# Patient Record
Sex: Female | Born: 1990 | Race: Black or African American | Hispanic: No | Marital: Single | Smoking: Never smoker
Health system: Southern US, Community
[De-identification: ages and names within clinical notes are randomized; demographics above are authoritative.]

## PROBLEM LIST (undated history)

## (undated) ENCOUNTER — Inpatient Hospital Stay (HOSPITAL_COMMUNITY): Payer: Self-pay

## (undated) DIAGNOSIS — Z8619 Personal history of other infectious and parasitic diseases: Secondary | ICD-10-CM

## (undated) DIAGNOSIS — B9689 Other specified bacterial agents as the cause of diseases classified elsewhere: Secondary | ICD-10-CM

## (undated) DIAGNOSIS — Z789 Other specified health status: Secondary | ICD-10-CM

## (undated) DIAGNOSIS — O24419 Gestational diabetes mellitus in pregnancy, unspecified control: Secondary | ICD-10-CM

## (undated) DIAGNOSIS — N760 Acute vaginitis: Secondary | ICD-10-CM

## (undated) HISTORY — DX: Personal history of other infectious and parasitic diseases: Z86.19

## (undated) HISTORY — DX: Acute vaginitis: N76.0

## (undated) HISTORY — PX: NO PAST SURGERIES: SHX2092

## (undated) HISTORY — DX: Other specified bacterial agents as the cause of diseases classified elsewhere: B96.89

---
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
## 2009-06-16 ENCOUNTER — Emergency Department (HOSPITAL_COMMUNITY): Admission: EM | Admit: 2009-06-16 | Discharge: 2009-06-16 | Payer: Self-pay | Admitting: Emergency Medicine

## 2009-12-01 ENCOUNTER — Emergency Department (HOSPITAL_COMMUNITY): Admission: EM | Admit: 2009-12-01 | Discharge: 2009-12-02 | Payer: Self-pay | Admitting: Emergency Medicine

## 2010-06-23 NOTE — L&D Delivery Note (Cosign Needed)
Delivery Note  Dr. Emelda Fear present during delivery. At 6:30 PM a viable female was delivered via Vaginal, Spontaneous Delivery (Presentation: Right Occiput Anterior).  APGAR: 8, 9; weight 6 lb 3.4 oz (2818 g).   Placenta status: Intact, Spontaneous.  Cord:  with the following complications: None.  No shoulder complications. Fundus boggy at first. Uterine massage performed with subsequent firming of the uterus and good hemostasis. Additional pitocin administered.   Anesthesia: Epidural  Episiotomy: None Lacerations: 1st degree right labial Suture Repair: none Est. Blood Loss (mL): 200 mls  Mom to postpartum.  Baby to nursery-stable.  Michele Phillips 01/15/2011, 7:08 PM

## 2010-07-30 ENCOUNTER — Other Ambulatory Visit: Payer: Self-pay | Admitting: Obstetrics & Gynecology

## 2010-07-30 DIAGNOSIS — Z0489 Encounter for examination and observation for other specified reasons: Secondary | ICD-10-CM

## 2010-07-30 DIAGNOSIS — R772 Abnormality of alphafetoprotein: Secondary | ICD-10-CM

## 2010-08-15 ENCOUNTER — Ambulatory Visit (HOSPITAL_COMMUNITY)
Admission: RE | Admit: 2010-08-15 | Discharge: 2010-08-15 | Disposition: A | Discharge status: Home / Self Care | Payer: Medicaid Other | Source: Ambulatory Visit | Attending: Obstetrics & Gynecology | Admitting: Obstetrics & Gynecology

## 2010-08-15 DIAGNOSIS — O3500X0 Maternal care for (suspected) central nervous system malformation or damage in fetus, unspecified, not applicable or unspecified: Secondary | ICD-10-CM | POA: Insufficient documentation

## 2010-08-15 DIAGNOSIS — O358XX0 Maternal care for other (suspected) fetal abnormality and damage, not applicable or unspecified: Secondary | ICD-10-CM | POA: Insufficient documentation

## 2010-08-15 DIAGNOSIS — Z363 Encounter for antenatal screening for malformations: Secondary | ICD-10-CM | POA: Insufficient documentation

## 2010-08-15 DIAGNOSIS — R772 Abnormality of alphafetoprotein: Secondary | ICD-10-CM

## 2010-08-15 DIAGNOSIS — O350XX0 Maternal care for (suspected) central nervous system malformation in fetus, not applicable or unspecified: Secondary | ICD-10-CM | POA: Insufficient documentation

## 2010-08-15 DIAGNOSIS — Z1389 Encounter for screening for other disorder: Secondary | ICD-10-CM | POA: Insufficient documentation

## 2010-08-15 DIAGNOSIS — Z0489 Encounter for examination and observation for other specified reasons: Secondary | ICD-10-CM

## 2010-09-02 ENCOUNTER — Encounter (HOSPITAL_COMMUNITY): Payer: Self-pay | Admitting: Radiology

## 2010-09-02 ENCOUNTER — Emergency Department (HOSPITAL_COMMUNITY): Payer: Medicaid Other

## 2010-09-02 ENCOUNTER — Emergency Department (HOSPITAL_COMMUNITY)
Admission: EM | Admit: 2010-09-02 | Discharge: 2010-09-02 | Disposition: A | Discharge status: Home / Self Care | Payer: Medicaid Other | Attending: Emergency Medicine | Admitting: Emergency Medicine

## 2010-09-02 DIAGNOSIS — Z331 Pregnant state, incidental: Secondary | ICD-10-CM | POA: Insufficient documentation

## 2010-09-02 DIAGNOSIS — K5289 Other specified noninfective gastroenteritis and colitis: Secondary | ICD-10-CM | POA: Insufficient documentation

## 2010-09-02 DIAGNOSIS — R109 Unspecified abdominal pain: Secondary | ICD-10-CM | POA: Insufficient documentation

## 2010-09-02 LAB — BASIC METABOLIC PANEL
CO2: 22 mEq/L (ref 19–32)
Chloride: 106 mEq/L (ref 96–112)
Creatinine, Ser: 0.6 mg/dL (ref 0.4–1.2)
GFR calc Af Amer: >60 mL/min (ref >60)
Potassium: 3.8 mEq/L (ref 3.5–5.1)
Sodium: 137 mEq/L (ref 135–145)

## 2010-09-02 LAB — URINALYSIS, ROUTINE W REFLEX MICROSCOPIC
Bilirubin Urine: NEGATIVE
Hgb urine dipstick: NEGATIVE
Specific Gravity, Urine: 1.02 (ref 1.005–1.030)
Urobilinogen, UA: 0.2 mg/dL (ref 0.0–1.0)

## 2010-09-02 LAB — DIFFERENTIAL
Eosinophils Relative: 0 % (ref 0–5)
Lymphocytes Relative: 8 % — ABNORMAL LOW (ref 12–46)
Lymphs Abs: 0.5 10*3/uL — ABNORMAL LOW (ref 0.7–4.0)
Monocytes Absolute: 0.3 10*3/uL (ref 0.1–1.0)
Monocytes Relative: 5 % (ref 3–12)

## 2010-09-02 LAB — HEPATIC FUNCTION PANEL
Albumin: 3.4 g/dL — ABNORMAL LOW (ref 3.5–5.2)
Alkaline Phosphatase: 45 U/L (ref 39–117)
Total Protein: 6.7 g/dL (ref 6.0–8.3)

## 2010-09-02 LAB — CBC
HCT: 32.4 % — ABNORMAL LOW (ref 36.0–46.0)
MCHC: 34.6 g/dL (ref 30.0–36.0)
MCV: 85.5 fL (ref 78.0–100.0)
RDW: 13.8 % (ref 11.5–15.5)
WBC: 6.5 10*3/uL (ref 4.0–10.5)

## 2010-09-02 LAB — WET PREP, GENITAL: Trich, Wet Prep: NONE SEEN

## 2010-09-02 LAB — HCG, QUANTITATIVE, PREGNANCY: hCG, Beta Chain, Quant, S: 27390 m[IU]/mL — ABNORMAL HIGH (ref <5)

## 2010-09-03 LAB — URINE CULTURE

## 2010-09-03 LAB — GC/CHLAMYDIA PROBE AMP, GENITAL
Chlamydia, DNA Probe: NEGATIVE
GC Probe Amp, Genital: NEGATIVE

## 2010-09-03 LAB — RPR: RPR Ser Ql: NONREACTIVE

## 2011-01-14 ENCOUNTER — Encounter (HOSPITAL_COMMUNITY): Payer: Self-pay | Admitting: *Deleted

## 2011-01-14 ENCOUNTER — Inpatient Hospital Stay (HOSPITAL_COMMUNITY)
Admission: AD | Admit: 2011-01-14 | Discharge: 2011-01-15 | Disposition: A | Discharge status: Home / Self Care | Payer: Medicaid Other | Source: Ambulatory Visit | Attending: Obstetrics & Gynecology | Admitting: Obstetrics & Gynecology

## 2011-01-14 ENCOUNTER — Encounter (HOSPITAL_COMMUNITY): Payer: Self-pay

## 2011-01-14 DIAGNOSIS — O479 False labor, unspecified: Secondary | ICD-10-CM | POA: Insufficient documentation

## 2011-01-14 DIAGNOSIS — O481 Prolonged pregnancy: Secondary | ICD-10-CM | POA: Insufficient documentation

## 2011-01-14 HISTORY — DX: Other specified health status: Z78.9

## 2011-01-14 NOTE — Progress Notes (Signed)
Pt presents to [facility] for labor check.  States ctx started getting stronger around 2000.  efm and toco placed.  Assessing.

## 2011-01-14 NOTE — H&P (Signed)
Ladon Heney is a 20 y.o. female presenting for induction of labor for postdates pregnancy.  Induction has been scheduled for 01/20/2011, and patient has presented to [facility] in early labor prior to that admission date.  Maternal Medical History:  Fetal activity: Perceived fetal activity is normal.    Prenatal complications: no prenatal complications No bleeding, HIV or hypertension.   Prenatal Complications - Diabetes: none.    OB History    Grav Para Term Preterm Abortions TAB SAB Ect Mult Living   1 0 0 0 0     0     No past medical history on file. No past surgical history on file. Family History: family history is not on file. Social History:  does not have a smoking history on file. She does not have any smokeless tobacco history on file. Her alcohol and drug histories not on file.  Review of Systems  Genitourinary: Negative for dysuria, urgency and frequency.  All other systems reviewed and are negative.  prenatal course notable for simple prenatal care, appropriate growth, and vital signs. During the third trimester she was treated weekly for the development of genital warts with 85% TCA with partial resolution    There were no vitals taken for this visit. Maternal Exam:  Abdomen: Patient reports no abdominal tenderness. Fundal height is 38cm.   Estimated fetal weight is 7lb 12 oz.   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 Normal vagina.  Pelvis: adequate for delivery.      Physical Exam  Constitutional: She appears well-developed and well-nourished.  Cardiovascular: Normal rate and regular rhythm.   Genitourinary:       Multiple small condyloma at post fourchette.  Psychiatric: She has a normal mood and affect. Her speech is normal and behavior is normal.    Prenatal labs: ABO, Rh:  o pos   Antibody:  neg Rubella:  immune RPR: NON REACTIVE (03/12 1513)  HBsAg:   nonreactive  HIV:   neg GBS:    POS  Assessment/Plan: sCHEDULED IOL 01/20/2011, CONVERTED to routine admit upon onset of labor  Ryelan Kazee V 01/14/2011, 2:56 PM

## 2011-01-14 NOTE — Progress Notes (Signed)
Pt presents for a labor check-appears relaxed with no signs of disconfort

## 2011-01-15 ENCOUNTER — Encounter (HOSPITAL_COMMUNITY): Payer: Self-pay

## 2011-01-15 ENCOUNTER — Encounter (HOSPITAL_COMMUNITY): Payer: Self-pay | Admitting: Anesthesiology

## 2011-01-15 ENCOUNTER — Inpatient Hospital Stay (HOSPITAL_COMMUNITY): Payer: Medicaid Other | Admitting: Anesthesiology

## 2011-01-15 ENCOUNTER — Inpatient Hospital Stay (HOSPITAL_COMMUNITY)
Admission: AD | Admit: 2011-01-15 | Discharge: 2011-01-17 | DRG: 775 | Disposition: A | Discharge status: Home / Self Care | Payer: Medicaid Other | Source: Ambulatory Visit | Attending: Obstetrics & Gynecology | Admitting: Obstetrics & Gynecology

## 2011-01-15 DIAGNOSIS — Z22330 Carrier of Group B streptococcus: Secondary | ICD-10-CM

## 2011-01-15 DIAGNOSIS — O99892 Other specified diseases and conditions complicating childbirth: Secondary | ICD-10-CM | POA: Diagnosis present

## 2011-01-15 LAB — CBC
HCT: 33.6 % — ABNORMAL LOW (ref 36.0–46.0)
MCH: 27.2 pg (ref 26.0–34.0)
MCV: 81 fL (ref 78.0–100.0)
Platelets: 227 10*3/uL (ref 150–400)
RBC: 4.15 MIL/uL (ref 3.87–5.11)

## 2011-01-15 MED ORDER — TETANUS-DIPHTH-ACELL PERTUSSIS 5-2.5-18.5 LF-MCG/0.5 IM SUSP
0.5000 mL | Freq: Once | INTRAMUSCULAR | Status: AC
  Administered 2011-01-16: 0.5 mL via INTRAMUSCULAR

## 2011-01-15 MED ORDER — LACTATED RINGERS IV SOLN: 500.0000 mL | INTRAVENOUS | Status: DC | PRN

## 2011-01-15 MED ORDER — ACETAMINOPHEN 325 MG PO TABS
650.0000 mg | ORAL_TABLET | ORAL | Status: DC | PRN
  Administered 2011-01-15: 650 mg via ORAL
  Filled 2011-01-15: qty 2

## 2011-01-15 MED ORDER — NALBUPHINE SYRINGE 5 MG/0.5 ML
5.0000 mg | INJECTION | INTRAMUSCULAR | Status: DC | PRN
  Administered 2011-01-15: 5 mg via INTRAVENOUS
  Filled 2011-01-15 (×2): qty 0.5

## 2011-01-15 MED ORDER — LACTATED RINGERS IV SOLN
500.0000 mL | Freq: Once | INTRAVENOUS | Status: AC
  Administered 2011-01-15: 500 mL via INTRAVENOUS

## 2011-01-15 MED ORDER — IBUPROFEN 600 MG PO TABS: 600.0000 mg | ORAL_TABLET | Freq: Four times a day (QID) | ORAL | Status: DC | PRN

## 2011-01-15 MED ORDER — LIDOCAINE HCL (PF) 1 % IJ SOLN
30.0000 mL | INTRAMUSCULAR | Status: DC | PRN
  Filled 2011-01-15 (×2): qty 30

## 2011-01-15 MED ORDER — OXYTOCIN 10 UNIT/ML IJ SOLN
INTRAMUSCULAR | Status: AC
  Administered 2011-01-15: 20 [IU] via INTRAVENOUS
  Filled 2011-01-15: qty 2

## 2011-01-15 MED ORDER — LACTATED RINGERS IV SOLN
INTRAVENOUS | Status: DC
  Administered 2011-01-15 (×2): via INTRAVENOUS
  Administered 2011-01-15: 500 mL via INTRAVENOUS
  Administered 2011-01-15 (×2): 250 mL via INTRAVENOUS

## 2011-01-15 MED ORDER — PHENYLEPHRINE 40 MCG/ML (10ML) SYRINGE FOR IV PUSH (FOR BLOOD PRESSURE SUPPORT)
80.0000 ug | PREFILLED_SYRINGE | INTRAVENOUS | Status: DC | PRN
  Filled 2011-01-15: qty 5

## 2011-01-15 MED ORDER — PRENATAL PLUS 27-1 MG PO TABS
1.0000 | ORAL_TABLET | Freq: Every day | ORAL | Status: DC
  Administered 2011-01-16 – 2011-01-17 (×2): 1 via ORAL
  Filled 2011-01-15: qty 1

## 2011-01-15 MED ORDER — SIMETHICONE 80 MG PO CHEW: 80.0000 mg | CHEWABLE_TABLET | ORAL | Status: DC | PRN

## 2011-01-15 MED ORDER — BENZOCAINE-MENTHOL 20-0.5 % EX AERO: 1.0000 "application " | INHALATION_SPRAY | CUTANEOUS | Status: DC | PRN

## 2011-01-15 MED ORDER — IBUPROFEN 600 MG PO TABS
600.0000 mg | ORAL_TABLET | Freq: Four times a day (QID) | ORAL | Status: DC
  Administered 2011-01-15 – 2011-01-17 (×6): 600 mg via ORAL
  Filled 2011-01-15 (×6): qty 1

## 2011-01-15 MED ORDER — MISOPROSTOL 200 MCG PO TABS
ORAL_TABLET | ORAL | Status: AC
  Filled 2011-01-15: qty 5

## 2011-01-15 MED ORDER — ONDANSETRON HCL 4 MG PO TABS: 4.0000 mg | ORAL_TABLET | ORAL | Status: DC | PRN

## 2011-01-15 MED ORDER — OXYCODONE-ACETAMINOPHEN 5-325 MG PO TABS: 1.0000 | ORAL_TABLET | ORAL | Status: DC | PRN

## 2011-01-15 MED ORDER — CITRIC ACID-SODIUM CITRATE 334-500 MG/5ML PO SOLN: 30.0000 mL | ORAL | Status: DC | PRN

## 2011-01-15 MED ORDER — DIPHENHYDRAMINE HCL 50 MG/ML IJ SOLN: 12.5000 mg | INTRAMUSCULAR | Status: DC | PRN

## 2011-01-15 MED ORDER — SENNOSIDES-DOCUSATE SODIUM 8.6-50 MG PO TABS
1.0000 | ORAL_TABLET | Freq: Every day | ORAL | Status: DC
  Administered 2011-01-16: 1 via ORAL

## 2011-01-15 MED ORDER — WITCH HAZEL-GLYCERIN EX PADS: MEDICATED_PAD | CUTANEOUS | Status: DC | PRN

## 2011-01-15 MED ORDER — EPHEDRINE 5 MG/ML INJ
10.0000 mg | INTRAVENOUS | Status: DC | PRN
  Filled 2011-01-15 (×2): qty 4

## 2011-01-15 MED ORDER — DIPHENHYDRAMINE HCL 25 MG PO CAPS: 25.0000 mg | ORAL_CAPSULE | Freq: Four times a day (QID) | ORAL | Status: DC | PRN

## 2011-01-15 MED ORDER — DEXTROSE 5 % IV SOLN
140.0000 mg | Freq: Three times a day (TID) | INTRAVENOUS | Status: DC
  Administered 2011-01-15: 140 mg via INTRAVENOUS
  Filled 2011-01-15 (×2): qty 3.5

## 2011-01-15 MED ORDER — OXYTOCIN 20 UNITS IN LACTATED RINGERS INFUSION - SIMPLE: 125.0000 mL/h | INTRAVENOUS | Status: DC | PRN

## 2011-01-15 MED ORDER — ONDANSETRON HCL 4 MG/2ML IJ SOLN
4.0000 mg | Freq: Four times a day (QID) | INTRAMUSCULAR | Status: DC | PRN
  Administered 2011-01-15: 4 mg via INTRAVENOUS
  Filled 2011-01-15: qty 2

## 2011-01-15 MED ORDER — PENICILLIN G POTASSIUM 5000000 UNITS IJ SOLR
5.0000 10*6.[IU] | Freq: Once | INTRAVENOUS | Status: DC
  Administered 2011-01-15: 5 10*6.[IU] via INTRAVENOUS
  Filled 2011-01-15: qty 5

## 2011-01-15 MED ORDER — LANOLIN HYDROUS EX OINT: TOPICAL_OINTMENT | CUTANEOUS | Status: DC | PRN

## 2011-01-15 MED ORDER — ONDANSETRON HCL 4 MG/2ML IJ SOLN: 4.0000 mg | INTRAMUSCULAR | Status: DC | PRN

## 2011-01-15 MED ORDER — FLEET ENEMA 7-19 GM/118ML RE ENEM: 1.0000 | ENEMA | RECTAL | Status: DC | PRN

## 2011-01-15 MED ORDER — PHENYLEPHRINE 40 MCG/ML (10ML) SYRINGE FOR IV PUSH (FOR BLOOD PRESSURE SUPPORT)
80.0000 ug | PREFILLED_SYRINGE | INTRAVENOUS | Status: DC | PRN
  Filled 2011-01-15 (×2): qty 5

## 2011-01-15 MED ORDER — FENTANYL 2.5 MCG/ML BUPIVACAINE 1/10 % EPIDURAL INFUSION (WH - ANES)
14.0000 mL/h | INTRAMUSCULAR | Status: DC
  Administered 2011-01-15 (×3): 14 mL/h via EPIDURAL
  Filled 2011-01-15 (×3): qty 60

## 2011-01-15 MED ORDER — OXYTOCIN 20 UNITS IN LACTATED RINGERS INFUSION - SIMPLE
125.0000 mL/h | Freq: Once | INTRAVENOUS | Status: DC
  Administered 2011-01-15: 999 mL/h via INTRAVENOUS
  Filled 2011-01-15: qty 1000

## 2011-01-15 MED ORDER — OXYCODONE-ACETAMINOPHEN 5-325 MG PO TABS: 2.0000 | ORAL_TABLET | ORAL | Status: DC | PRN

## 2011-01-15 MED ORDER — ZOLPIDEM TARTRATE 5 MG PO TABS: 5.0000 mg | ORAL_TABLET | Freq: Every evening | ORAL | Status: DC | PRN

## 2011-01-15 MED ORDER — EPHEDRINE 5 MG/ML INJ
10.0000 mg | INTRAVENOUS | Status: DC | PRN
  Filled 2011-01-15: qty 4

## 2011-01-15 MED ORDER — PENICILLIN G POTASSIUM 5000000 UNITS IJ SOLR
2.5000 10*6.[IU] | INTRAVENOUS | Status: DC
  Administered 2011-01-15 (×3): 2.5 10*6.[IU] via INTRAVENOUS
  Filled 2011-01-15 (×5): qty 2.5

## 2011-01-15 NOTE — Progress Notes (Signed)
  Michele Phillips is a 20 y.o. G1P0000 at [redacted]w[redacted]d  admitted for spontaneous onset of labor Pt seen at 13:00, note not written due to delivery with another patient.  Subjective: Pt feeling comfortable. No complaints.   Objective: BP 124/82  Pulse 69  Temp(Src) 98.1 F (36.7 C) (Axillary)  Resp 20  Ht 5\' 2"  (1.575 m)  Wt 150 lb (68.04 kg)  BMI 27.44 kg/m2  SpO2 99%     FHT:  FHR: 120 bpm, variability: moderate,  accelerations:  Present,  decelerations:  Present occasional variables UC:   regular, every 2-3 minutes, MVU: 180s SVE:   Dilation: 7 Effacement (%): 100 Station: 0 Exam by:: Dr. Gwenlyn Saran & E. Cone RNC  Labs: Lab Results  Component Value Date   WBC 12.1* 01/15/2011   HGB 11.3* 01/15/2011   HCT 33.6* 01/15/2011   MCV 81.0 01/15/2011   PLT 227 01/15/2011    Assessment / Plan: Spontaneous labor, progressing normally  Labor: cervical change is observed. Continue monitoring MVUs for adequate contractions. Will hold off on pitocin for now.  Preeclampsia:  NA Fetal Wellbeing:  Category II Pain Control:  Epidural I/D:  GBS pos Anticipated MOD:  NSVD  Michele Phillips 01/15/2011, 1:53 PM

## 2011-01-15 NOTE — Progress Notes (Addendum)
  Michele Phillips is a 20 y.o. G1P0000 at [redacted]w[redacted]d  admitted for active labor  Subjective: Pt having some variables. On oxygen and on her side. Pt AROM, clear. IUPC and fetal scalp electrode placed.  Objective: BP 124/80  Pulse 65  Temp(Src) 97.8 F (36.6 C) (Oral)  Resp 20  Ht 5\' 2"  (1.575 m)  Wt 150 lb (68.04 kg)  BMI 27.44 kg/m2  SpO2 99%      FHT:  FHR: 135 bpm, variability: moderate,  accelerations:  Abscent,  decelerations:  Present variables and occasional late UC:   regular, every 2-5 minutes SVE:   Dilation: 5 Effacement (%): 100 Station: -1 Exam by:: E. Cone RNC  Labs: Lab Results  Component Value Date   WBC 12.1* 01/15/2011   HGB 11.3* 01/15/2011   HCT 33.6* 01/15/2011   MCV 81.0 01/15/2011   PLT 227 01/15/2011    Assessment / Plan: Spontaneous labor, progressing normally, s/p AROM clear and IUPC and fetal scalp electrode placement.   Labor: Progressing normally Preeclampsia:  na Fetal Wellbeing:  Category II. Continue monitoring closely. Continue O2 and position change.  Pain Control:  Epidural I/D:  GBS pos Anticipated MOD:  NSVD  LOSQ, STEPHANIE 01/15/2011, 11:06 AM  Will utilize Amnioinfusion if needed for variable decels. Will continue to observe.  Wynelle Bourgeois CNM

## 2011-01-15 NOTE — Progress Notes (Signed)
S:  Comfortable with epidural now.   O:  AVSS,   Filed Vitals:   01/15/11 0931  BP:   Pulse: 69  Temp:   Resp:     FHR Reactive with good accels UCs every 2 minutes. Cervix:  4-5/100%/-1/vtx/BBOW  A:  Active Labor  P:  Will defer amniotomy for now, due to pt being primiparous, will reconsider later if labor does not progress Anticipate SVD

## 2011-01-15 NOTE — Anesthesia Preprocedure Evaluation (Addendum)
Anesthesia Evaluation  Name, MR# and DOB Patient awake  General Assessment Comment  Reviewed: Allergy & Precautions, H&P  and Patient's Chart, lab work & pertinent test results  Airway Mallampati: II TM Distance: >3 FB Neck ROM: full    Dental  (+) Teeth Intact   Pulmonary  clear to auscultation    Cardiovascular regular Normal   Neuro/Psych  GI/Hepatic/Renal   Endo/Other   Abdominal   Musculoskeletal  Hematology   Peds  Reproductive/Obstetrics (+) Pregnancy   Anesthesia Other Findings                 Anesthesia Physical Anesthesia Plan  ASA: II  Anesthesia Plan: Epidural   Post-op Pain Management:    Induction:   Airway Management Planned:   Additional Equipment:   Intra-op Plan:   Post-operative Plan:   Informed Consent: I have reviewed the patients History and Physical, chart, labs and discussed the procedure including the risks, benefits and alternatives for the proposed anesthesia with the patient or authorized representative who has indicated his/her understanding and acceptance.   Dental Advisory Given  Plan Discussed with: CRNA and Surgeon  Anesthesia Plan Comments: (Labs checked- platelets confirmed with RN in room. Fetal heart tracing, per RN, reportedly stable enough for sitting procedure. Discussed epidural, and patient consents to the procedure:  included risk of possible headache,backache, failed block, allergic reaction, and nerve injury. This patient was asked if she had any questions or concerns before the procedure started. )        Anesthesia Quick Evaluation  

## 2011-01-15 NOTE — Plan of Care (Signed)
Problem: Consults Goal: Birthing Suites Patient Information Press F2 to bring up selections list   Pt > [redacted] weeks EGA     

## 2011-01-15 NOTE — Anesthesia Procedure Notes (Addendum)
Epidural Patient location during procedure: OB Start time: 01/15/2011 8:36 AM  Staffing Anesthesiologist: Jiles Garter  Preanesthetic Checklist Completed: patient identified, site marked, surgical consent, pre-op evaluation, timeout performed, IV checked, risks and benefits discussed and monitors and equipment checked  Epidural Patient position: sitting Prep: site prepped and draped and DuraPrep Patient monitoring: continuous pulse ox and blood pressure Approach: midline Injection technique: LOR air  Needle:  Needle type: Tuohy  Needle gauge: 17 G Needle length: 9 cm Needle insertion depth: 6 cm Catheter type: closed end flexible Catheter size: 19 Gauge Catheter at skin depth: 12 cm Test dose: negative  Assessment Events: blood not aspirated, injection not painful, no injection resistance, negative IV test and no paresthesia  Additional Notes Dosing of Epidural: 1st dose, Through needle...... 5mg  Marcaine 2nd dose, through catheter.... epi 1:200K + Xylocaine 40 mg 3rd dose, through catheter...Marland KitchenMarland Kitchenepi 1:200K + Xylocaine 60 mg Each dose occurred after waiting 3 min,patient was free of IV sx; and patient exhibits no evidence of SA injection  Patient is more comfortable after epidural dosed. Please see RN's note for documentation of vital signs,and FHR which are stable.

## 2011-01-15 NOTE — Plan of Care (Signed)
Problem: Consults Goal: Birthing Suites Patient Information Press F2 to bring up selections list  Outcome: Completed/Met Date Met:  01/15/11  Pt > [redacted] weeks EGA

## 2011-01-15 NOTE — Progress Notes (Signed)
Dr. Natale Milch notified of pt here for bleeding.  Orders to check pt and call back with result.

## 2011-01-15 NOTE — Progress Notes (Signed)
ANTIBIOTIC CONSULT NOTE - INITIAL  Pharmacy Consult for Gentamicin  Indication: Maternal Fever during Labor  Patient Measurements: Height: 5\' 2"  (157.5 cm) Weight: 150 lb (68.04 kg) IBW/kg (Calculated) : 50.1  Adjusted Body Weight: 55.5 kg  Vital Signs: Temp: 101.3 F (38.5 C) (07/25 1605) Temp src: Axillary (07/25 1605) BP: 127/77 mmHg (07/25 1605) Pulse Rate: 81  (07/25 1605)      Labs:  Basename 01/15/11 0510  WBC 12.1*  HGB 11.3*  PLT 227  LABCREA --  CREATININE --  CRCLEARANCE --   No results found for this basename: VANCOTROUGH:2,VANCOPEAK:2,VANCORANDOM:2,GENTTROUGH:2,GENTPEAK:2,GENTRANDOM:2,TOBRATROUGH:2,TOBRAPEAK:2,TOBRARND:2,AMIKACINPEAK:2,AMIKACINTROU:2,AMIKACIN:2, in the last 72 hours   Microbiology: No results found for this or any previous visit (from the past 720 hour(s)).  Medical History: Past Medical History  Diagnosis Date  . No pertinent past medical history     Medications:  Penicillin protocol for Positive GBS status Assessment: 20 yo in active labor with temp 101.3  Goal of Therapy:  Gentamcin peaks 6-57mcg/ml; Gentamicin trough < 57mcg/ml  Plan:  1. Gentamicin 140mg  IV q8h starting now. 2. No SCr available so will estimate Scr=0.7. Will draw SCr if continued postpartum or > 72 hours. 3. Will continue to follow and assess need for Gentamicin levels based on clinical indication. Thanks!  Claybon Jabs 01/15/2011,4:51 PM

## 2011-01-15 NOTE — H&P (Signed)
Michele Phillips is a 20 y.o. female presenting for labor assessment. Pt is a G1 @ 40.[redacted] wks EGA who receives her care at [facility]. 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 She has hx of genital warts but no other STIs. Pt has no significant medical hx, and has had no surgeries. Maternal Medical History:  Reason for admission: Reason for admission: contractions and vaginal bleeding.  Contractions: Onset was 3-5 hours ago.   Frequency: regular.   Perceived severity is moderate.    Fetal activity: Perceived fetal activity is normal.   Last perceived fetal movement was within the past hour.    Prenatal complications: No bleeding, cholelithiasis, HIV, hypertension, infection, IUGR, nephrolithiasis, oligohydramnios, placental abnormality, polyhydramnios, pre-eclampsia, preterm labor, substance abuse or thrombocytopenia.   Prenatal Complications - Diabetes: none.    OB History    Grav Para Term Preterm Abortions TAB SAB Ect Mult Living   1 0 0 0 0     0     Past Medical History  Diagnosis Date  . No pertinent past medical history    Past Surgical History  Procedure Date  . No past surgeries    Family History: family history is not on file. Social History:  reports that she has never smoked. She has never used smokeless tobacco. She reports that she does not drink alcohol or use illicit drugs.  Review of Systems  Constitutional: Negative.   HENT: Negative.   Eyes: Negative.   Respiratory: Negative.   Cardiovascular: Negative.   Gastrointestinal: Negative.   Genitourinary: Negative.   Musculoskeletal: Negative.   Skin: Negative.   Neurological: Negative.   Endo/Heme/Allergies: Negative.   Psychiatric/Behavioral: Negative.     Dilation: 3 Effacement (%): 90 Station: -1 Exam by:: M.,Topp,RN >Reported as significant change in cervical exam since last seen  for labor check.  Blood pressure 112/67, pulse 78, temperature 97.8 F (36.6 C), temperature source Oral, resp. rate 20, height 5\' 2"  (1.575 m), weight 150 lb (68.04 kg).   Maternal Exam:  Uterine Assessment: Contraction strength is moderate.  Abdomen: Patient reports generalized tenderness.  Introitus: Normal vulva. Normal vagina.  Pelvis: adequate for delivery.      Physical Exam  Constitutional: She is oriented to person, place, and time. She appears well-developed and well-nourished. No distress.  HENT:  Head: Normocephalic.  Eyes: Pupils are equal, round, and reactive to light.  Neck: Normal range of motion.  Cardiovascular: Normal rate, regular rhythm, normal heart sounds and intact distal pulses.  Exam reveals no gallop and no friction rub.   No murmur heard. Respiratory: Effort normal and breath sounds normal. No respiratory distress. She has no wheezes. She has no rales.  GI: Soft. Bowel sounds are normal. There is generalized tenderness. There is no guarding.  Musculoskeletal: Normal range of motion.  Neurological: She is alert and oriented to person, place, and time. She has normal reflexes. She displays normal reflexes. No cranial nerve deficit.  Skin: Skin is warm and dry. No rash noted. She is not diaphoretic.  Psychiatric: She has a normal mood and affect.    Prenatal labs: ABO, Rh:  O+ Antibody:  Neg Rubella:  Immune RPR: NON REACTIVE (03/12 1513)  HBsAg:   Neg HIV:   Neg GBS:   Positive GC/Ch Neg 2 hr GTT: 79-154-109  Assessment/Plan: Active Labor @ 40.[redacted] wks EGA  Admit to L&D. PCN for GBS +.  Pt desires epidural. Plans to breastfeed. Wishes for Implenon for birth control after delivery.  Michele Phillips N 01/15/2011, 4:57 AM

## 2011-01-15 NOTE — Progress Notes (Signed)
Pt presents for a labor check-grimacing and breathing hard with u/c's-relaxed between

## 2011-01-15 NOTE — Progress Notes (Signed)
Dr. Natale Milch notified of pt arrival and c/o labor.  Notified of VE.  Will watch for 1 hour and recheck cervix.

## 2011-01-15 NOTE — Progress Notes (Signed)
Dr. Natale Milch notified of no cervical change.  Ok to [employer] home.

## 2011-01-16 NOTE — Progress Notes (Signed)
  Post Partum Day 1 Subjective: no complaints, up ad lib, voiding, tolerating PO and bottle and breastfeeding. Minimal abdominal pain. Plans on implanon for contraception.  Objective: Blood pressure 114/78, pulse 72, temperature 98.1 F (36.7 C), temperature source Oral, resp. rate 18, height 5\' 2"  (1.575 m), weight 150 lb (68.04 kg), SpO2 99.00%.  Physical Exam:  General: alert, cooperative and no distress, breastfeeding Lochia: appropriate Uterine Fundus: firm, at umbilicus DVT Evaluation: No evidence of DVT seen on physical exam. Negative Homan's sign.   Basename 01/15/11 0510  HGB 11.3*  HCT 33.6*    Assessment/Plan: 20yo G1P1 ppd#1. Doing well. High BP on ppd#0 have now resolved.  Continue postpartum care. Plan for discharge tomorrow. [facility]    LOS: 1 day   Passion Lavin 01/16/2011, 7:15 AM

## 2011-01-16 NOTE — Anesthesia Postprocedure Evaluation (Signed)
  Anesthesia Post-op Note  Patient: Michele Phillips  This patient has recovered from her labor epidural, and I am not aware of any complications or problems.

## 2011-01-16 NOTE — Progress Notes (Signed)
UR chart review completed.  

## 2011-01-16 NOTE — Discharge Summary (Signed)
  Obstetric Discharge Summary Reason for Admission: onset of labor Prenatal Procedures: ultrasound Intrapartum Procedures: spontaneous vaginal delivery Postpartum Procedures: none Complications-Operative and Postpartum: 1 degree perineal laceration   Delivery Note At 6:30 PM a viable female was delivered via Vaginal, Spontaneous Delivery (Presentation: Right Occiput Anterior).  APGAR: 8, 9; weight 6 lb 3.4 oz (2818 g).   Placenta status: Intact, Spontaneous.  Cord:  with the following complications: None.  Anesthesia: Epidural  Episiotomy: None Lacerations:  Suture Repair: none Est. Blood Loss (mL):   Mom to postpartum.  Baby to nursery-stable.  CRESENZO-DISHMAN,Rami Budhu 01/16/2011, 7:55 PM     H/H: Lab Results  Component Value Date/Time   HGB 11.3* 01/15/2011  5:10 AM   HCT 33.6* 01/15/2011  5:10 AM      Discharge Diagnoses: Term Pregnancy-delivered  Discharge Information: Date: 01/02/2011 Activity: pelvic rest Diet: routine Medications: Ibuprophen Breast feeding:  Yes Condition: stable Instructions: refer to practice specific booklet Discharge to: home   CRESENZO-DISHMAN,Aniqua Briere 01/16/2011,7:55 PM

## 2011-01-17 ENCOUNTER — Encounter (HOSPITAL_COMMUNITY): Payer: Self-pay | Admitting: *Deleted

## 2011-01-17 MED ORDER — IBUPROFEN 600 MG PO TABS: 600.0000 mg | ORAL_TABLET | Freq: Four times a day (QID) | ORAL | Status: AC

## 2011-01-17 NOTE — Progress Notes (Signed)
Post Partum Day 2 Subjective: no complaints, up ad lib, voiding and tolerating PO  Objective: Blood pressure 117/74, pulse 56, temperature 97.7 F (36.5 C), temperature source Oral, resp. rate 16, height 5\' 2"  (1.575 m), weight 68.04 kg (150 lb), SpO2 99.00%.  Physical Exam:  General: alert, cooperative and no distress Lochia: appropriate Uterine Fundus: firm Incision:  DVT Evaluation: No evidence of DVT seen on physical exam.   Basename 01/15/11 0510  HGB 11.3*  HCT 33.6*    Assessment/Plan: Discharge home   LOS: 2 days   CRESENZO-DISHMAN,Bayleigh Loflin 01/17/2011, 7:45 AM

## 2011-01-19 ENCOUNTER — Inpatient Hospital Stay (HOSPITAL_COMMUNITY)
Admission: RE | Admit: 2011-01-19 | Discharge: 2011-01-19 | Payer: Medicaid Other | Source: Ambulatory Visit | Attending: Obstetrics and Gynecology | Admitting: Obstetrics and Gynecology

## 2011-01-21 NOTE — ED Provider Notes (Signed)
Please refer to admission H&P by Dr. Natale Milch for term labor admission details.  Michele Phillips

## 2011-01-31 ENCOUNTER — Encounter (HOSPITAL_COMMUNITY): Payer: Self-pay | Admitting: *Deleted

## 2011-01-31 DIAGNOSIS — A630 Anogenital (venereal) warts: Secondary | ICD-10-CM | POA: Insufficient documentation

## 2011-03-14 LAB — INFLUENZA A+B VIRUS AG-DIRECT(RAPID)
Inflenza A Ag: NEGATIVE
Influenza B Ag: NEGATIVE

## 2011-04-22 ENCOUNTER — Emergency Department (HOSPITAL_COMMUNITY): Payer: Medicaid Other

## 2011-04-22 ENCOUNTER — Emergency Department (HOSPITAL_COMMUNITY)
Admission: EM | Admit: 2011-04-22 | Discharge: 2011-04-23 | Disposition: A | Discharge status: Home / Self Care | Payer: Medicaid Other | Attending: Emergency Medicine | Admitting: Emergency Medicine

## 2011-04-22 ENCOUNTER — Encounter (HOSPITAL_COMMUNITY): Payer: Self-pay

## 2011-04-22 DIAGNOSIS — S43016A Anterior dislocation of unspecified humerus, initial encounter: Secondary | ICD-10-CM | POA: Insufficient documentation

## 2011-04-22 DIAGNOSIS — X500XXA Overexertion from strenuous movement or load, initial encounter: Secondary | ICD-10-CM | POA: Insufficient documentation

## 2011-04-22 DIAGNOSIS — Y92009 Unspecified place in unspecified non-institutional (private) residence as the place of occurrence of the external cause: Secondary | ICD-10-CM | POA: Insufficient documentation

## 2011-04-22 MED ORDER — PROPOFOL 10 MG/ML IV EMUL
INTRAVENOUS | Status: AC
  Filled 2011-04-22: qty 100

## 2011-04-22 MED ORDER — MORPHINE SULFATE 4 MG/ML IJ SOLN
4.0000 mg | Freq: Once | INTRAMUSCULAR | Status: AC
  Administered 2011-04-22: 4 mg via INTRAVENOUS
  Filled 2011-04-22: qty 1

## 2011-04-22 MED ORDER — ONDANSETRON HCL 4 MG/2ML IJ SOLN
4.0000 mg | Freq: Once | INTRAMUSCULAR | Status: AC
  Administered 2011-04-22: 4 mg via INTRAVENOUS
  Filled 2011-04-22: qty 2

## 2011-04-22 MED ORDER — PROPOFOL 10 MG/ML IV EMUL
0.5000 mg/kg | Freq: Once | INTRAVENOUS | Status: AC
  Administered 2011-04-22: 50 mg via INTRAVENOUS
  Filled 2011-04-22: qty 20

## 2011-04-22 NOTE — ED Provider Notes (Signed)
History     CSN: [id number] Arrival date & time: 04/22/2011  9:43 PM   First MD Initiated Contact with Patient 04/22/11 2144      Chief Complaint  Patient presents with  . Arm Injury    (Consider location/radiation/quality/duration/timing/severity/associated sxs/prior treatment) Patient is a 20 y.o. female presenting with arm injury. The history is provided by the patient.  Arm Injury  The incident occurred just prior to arrival. The incident occurred at home (She was lifting a box up onto a closet shelf when she felt a sudden pop and pain in her right shoulder.). There is an injury to the right shoulder. The pain is severe. Associated symptoms include nausea. Pertinent negatives include no chest pain, no numbness, no abdominal pain, no headaches, no neck pain, no focal weakness, no light-headedness, no weakness and no difficulty breathing. There have been no prior injuries to these areas. She is right-handed.    Past Medical History  Diagnosis Date  . No pertinent past medical history     Past Surgical History  Procedure Date  . No past surgeries     No family history on file.  History  Substance Use Topics  . Smoking status: Never Smoker   . Smokeless tobacco: Never Used  . Alcohol Use: No    OB History    Grav Para Term Preterm Abortions TAB SAB Ect Mult Living   1 1 1  0 0     1      Review of Systems  Constitutional: Negative for fever.  HENT: Negative for congestion, sore throat and neck pain.   Eyes: Negative.   Respiratory: Negative for chest tightness and shortness of breath.   Cardiovascular: Negative for chest pain.  Gastrointestinal: Positive for nausea. Negative for abdominal pain.  Genitourinary: Negative.   Musculoskeletal: Positive for arthralgias. Negative for joint swelling.  Skin: Negative.  Negative for rash and wound.  Neurological: Negative for dizziness, focal weakness, weakness, light-headedness, numbness and headaches.  Hematological:  Negative.   Psychiatric/Behavioral: Negative.     Allergies  Review of patient's allergies indicates no known allergies.  Home Medications  No current outpatient prescriptions on file.  BP 115/89  Pulse 66  Temp(Src) 98 F (36.7 C) (Oral)  Resp 16  Ht 5\' 2"  (1.575 m)  Wt 131 lb (59.421 kg)  BMI 23.96 kg/m2  SpO2 100%  LMP 04/01/2011  Breastfeeding? No  Physical Exam  Nursing note and vitals reviewed. Constitutional: She is oriented to person, place, and time. She appears well-developed and well-nourished.  HENT:  Head: Normocephalic and atraumatic.  Eyes: Conjunctivae are normal.  Neck: Normal range of motion.  Cardiovascular: Normal rate, regular rhythm, normal heart sounds and intact distal pulses.   Pulmonary/Chest: Effort normal and breath sounds normal. She has no wheezes.  Abdominal: Soft.  Musculoskeletal: She exhibits tenderness.       Right shoulder: She exhibits decreased range of motion, tenderness, deformity and pain. She exhibits normal pulse and normal strength.       Shoulder separation appreciated with anterior palpable humeral head.  Neurological: She is alert and oriented to person, place, and time.  Skin: Skin is warm and dry.  Psychiatric: She has a normal mood and affect.    ED Course  ORTHOPEDIC INJURY TREATMENT Date/Time: 04/22/2011 11:26 PM Performed by: Bartley Vuolo L Authorized by: Burgess Amor L Consent: Verbal consent obtained. Risks and benefits: risks, benefits and alternatives were discussed Consent given by: patient Patient understanding: patient states understanding of  the procedure being performed Patient identity confirmed: verbally with patient Time out: Immediately prior to procedure a "time out" was called to verify the correct patient, procedure, equipment, support staff and site/side marked as required. Injury location: shoulder Location details: right shoulder Injury type: dislocation Hill-Sachs deformity: noChronicity:  new Pre-procedure neurovascular assessment: neurovascularly intact Pre-procedure distal perfusion: normal Pre-procedure neurological function: normal Pre-procedure range of motion: reduced Local anesthesia used: no Manipulation performed: yes Reduction method: external rotation Reduction successful: yes X-ray confirmed reduction: yes Immobilization: sling Post-procedure distal perfusion: normal Post-procedure neurological function: normal Post-procedure range of motion: normal Patient tolerance: Patient tolerated the procedure well with no immediate complications.   (including critical care time)  Labs Reviewed - No data to display Dg Shoulder Right  04/22/2011  *RADIOLOGY REPORT*  Clinical Data: Severe pain in the right shoulder with limited range of motion after lifting injury.  RIGHT SHOULDER - 2+ VIEW  Comparison: None.  Findings: Anterior dislocation of the humeral head with respect to the glenoid.  No obvious fractures identified.  IMPRESSION: Anterior dislocation of the right shoulder.  Original Report Authenticated By: Marlon Pel, M.D.     No diagnosis found.    MDM  Anterior shoulder dislocation.   Post reduction films reduced.  Patient awake,  Alert at time of discharge,  Going home with family members.        Candis Musa, PA 04/23/11 [phone]

## 2011-04-22 NOTE — ED Notes (Addendum)
Patient remains resting sitting up. No distress. Patient's eyes open, responsive and answers questions appropriately. States pain is 7\10. Equal chest rise and all at 18x\minute, regular, nonlabored. Call bell within reach. Will continue to monitor.

## 2011-04-22 NOTE — ED Notes (Signed)
Placed on 10L O2 NRB as precaution. Sats 96% on room air prior to procedure. Sats 100% at this time.

## 2011-04-22 NOTE — ED Provider Notes (Signed)
History     CSN: [id number] Arrival date & time: 04/22/2011  9:43 PM   First MD Initiated Contact with Patient 04/22/11 2144      Chief Complaint  Patient presents with  . Arm Injury    (Consider location/radiation/quality/duration/timing/severity/associated sxs/prior treatment) HPI  Past Medical History  Diagnosis Date  . No pertinent past medical history     Past Surgical History  Procedure Date  . No past surgeries     No family history on file.  History  Substance Use Topics  . Smoking status: Never Smoker   . Smokeless tobacco: Never Used  . Alcohol Use: No    OB History    Grav Para Term Preterm Abortions TAB SAB Ect Mult Living   1 1 1  0 0     1      Review of Systems  Allergies  Review of patient's allergies indicates no known allergies.  Home Medications  No current outpatient prescriptions on file.  BP 103/72  Pulse 65  Temp(Src) 98 F (36.7 C) (Oral)  Resp 16  Ht 5\' 2"  (1.575 m)  Wt 131 lb (59.421 kg)  BMI 23.96 kg/m2  SpO2 96%  LMP 04/01/2011  Breastfeeding? No  Physical Exam Car.  RRR.  Lungs CTA ED Course  Procedural sedation Date/Time: 04/22/2011 11:21 PM Performed by: Linwood Dibbles R Authorized by: Linwood Dibbles R Consent: Verbal consent obtained. Written consent obtained. Risks and benefits: risks, benefits and alternatives were discussed Consent given by: patient Patient understanding: patient states understanding of the procedure being performed Patient consent: the patient's understanding of the procedure matches consent given Imaging studies: imaging studies available Patient identity confirmed: verbally with patient Time out: Immediately prior to procedure a "time out" was called to verify the correct patient, procedure, equipment, support staff and site/side marked as required. Local anesthesia used: no Patient sedated: yes Sedation type: moderate (conscious) sedation Sedatives: propofol Sedation end date/time:  04/22/2011 11:22 PM Vitals: Vital signs were monitored during sedation. Patient tolerance: Patient tolerated the procedure well with no immediate complications. Comments: See nursing note for start time.  Pt speaking post procedure.  No complications.   (including critical care time)  Labs Reviewed - No data to display Dg Shoulder Right  04/22/2011  *RADIOLOGY REPORT*  Clinical Data: Severe pain in the right shoulder with limited range of motion after lifting injury.  RIGHT SHOULDER - 2+ VIEW  Comparison: None.  Findings: Anterior dislocation of the humeral head with respect to the glenoid.  No obvious fractures identified.  IMPRESSION: Anterior dislocation of the right shoulder.  Original Report Authenticated By: Marlon Pel, M.D.     No diagnosis found.    MDM  Shoulder reduction performed under my direct supervision        Celene Kras, MD 04/22/11 2322

## 2011-04-22 NOTE — ED Notes (Signed)
Patient back to room from radiology. PA at bedside to discuss plan of care.

## 2011-04-22 NOTE — ED Notes (Signed)
Patient responding well. Awake to verbal stimuli. Very verbal when spoke to and making since. Alert and oriented to person, place, situation, and time. Will continue to monitor.

## 2011-04-22 NOTE — ED Notes (Signed)
Pain 10\10 at this time. No nausea. PA made aware.

## 2011-04-22 NOTE — ED Notes (Signed)
Pt reports bending over to pick something up with her right arm and felt a sharp pain shoot through her shoulder.  Pt reports inability to move her right  arm or shoulder.  Pt is tearful in triage.  Pt is guarding shoulder.

## 2011-04-22 NOTE — ED Notes (Signed)
Pt reports vomiting at home once from the pain.

## 2011-04-22 NOTE — ED Notes (Signed)
Into room to assess patient. Patient states she was lifting a heavy box high up onto a shelf when she felt something pop. Guarding right arm. Deformity to right shoulder. Strong right radial pulse, brisk cap refill. PA at bedside to evaluate.

## 2011-04-22 NOTE — ED Notes (Signed)
Patient remains resting sitting up in bed. Pain 7\10. Resting comfortably. Family with patient. In no distress. Call bell within reach. Bed in low position and locked. Will continue to monitor.

## 2011-04-22 NOTE — ED Notes (Signed)
ERROR IN MAR by this writer: Given 5 mg Propofol, not 50 mg. Total of 5 cc slow ivp.

## 2011-04-22 NOTE — ED Notes (Signed)
Sling applied to right shoulder. States it is comfortably. Strong right radial pulse. Brisk cap refill.

## 2011-04-22 NOTE — ED Notes (Signed)
Procedure complete. No further deformity to right shoulder.

## 2011-04-22 NOTE — ED Notes (Signed)
Time out preformed with md and pa at bedside.

## 2011-04-22 NOTE — ED Notes (Signed)
PA preforming manipulation and reduction of right shoulder at this time.

## 2011-04-23 MED ORDER — IBUPROFEN 800 MG PO TABS: 800.0000 mg | ORAL_TABLET | Freq: Three times a day (TID) | ORAL | Status: AC

## 2011-04-23 NOTE — ED Notes (Signed)
Into room to see patient. Resting sitting up in bed. A & O x 4. Denies any needs. Pain 4\10 at this time. Call bell within reach. States she is feeling a lot better. Family with patient. Bed in low position and locked with side rails up.

## 2011-04-24 NOTE — ED Provider Notes (Signed)
Medical screening examination/treatment/procedure(s) were performed by non-physician practitioner and as supervising physician I was immediately available for consultation/collaboration.   Celene Kras, MD 04/24/11 [phone]

## 2011-11-04 ENCOUNTER — Encounter (HOSPITAL_COMMUNITY): Payer: Self-pay | Admitting: *Deleted

## 2011-11-04 ENCOUNTER — Emergency Department (HOSPITAL_COMMUNITY)
Admission: EM | Admit: 2011-11-04 | Discharge: 2011-11-05 | Disposition: A | Discharge status: Home / Self Care | Payer: Medicaid Other | Attending: Emergency Medicine | Admitting: Emergency Medicine

## 2011-11-04 DIAGNOSIS — S40269A Insect bite (nonvenomous) of unspecified shoulder, initial encounter: Secondary | ICD-10-CM | POA: Insufficient documentation

## 2011-11-04 DIAGNOSIS — W57XXXA Bitten or stung by nonvenomous insect and other nonvenomous arthropods, initial encounter: Secondary | ICD-10-CM | POA: Insufficient documentation

## 2011-11-04 NOTE — ED Notes (Signed)
Removed tick from rt upper arm at work at [employer].  Now feels sob and body aches.

## 2011-11-05 MED ORDER — DOXYCYCLINE HYCLATE 100 MG PO CAPS: 100.0000 mg | ORAL_CAPSULE | Freq: Two times a day (BID) | ORAL | Status: AC

## 2011-11-05 MED ORDER — DOXYCYCLINE HYCLATE 100 MG PO TABS
100.0000 mg | ORAL_TABLET | Freq: Once | ORAL | Status: AC
  Administered 2011-11-05: 100 mg via ORAL
  Filled 2011-11-05: qty 1

## 2011-11-05 NOTE — Discharge Instructions (Signed)
Take the antibiotic as directed.  Follow up with the MD of your choice as needed.

## 2011-11-05 NOTE — ED Provider Notes (Signed)
History     CSN: [id number]  Arrival date & time 11/04/11  2219   First MD Initiated Contact with Patient 11/04/11 2248      Chief Complaint  Patient presents with  . Tick Removal    (Consider location/radiation/quality/duration/timing/severity/associated sxs/prior treatment) HPI Comments: Pt pulled tick off her R arm while at work today.  "it was barely stuck in my skin".  The history is provided by the patient. No language interpreter was used.    Past Medical History  Diagnosis Date  . No pertinent past medical history     Past Surgical History  Procedure Date  . No past surgeries     History reviewed. No pertinent family history.  History  Substance Use Topics  . Smoking status: Never Smoker   . Smokeless tobacco: Never Used  . Alcohol Use: No    OB History    Grav Para Term Preterm Abortions TAB SAB Ect Mult Living   1 1 1  0 0     1      Review of Systems  Constitutional: Negative for fever and chills.  Musculoskeletal: Negative for myalgias and arthralgias.  Skin: Negative for rash.  All other systems reviewed and are negative.    Allergies  Review of patient's allergies indicates no known allergies.  Home Medications   Current Outpatient Rx  Name Route Sig Dispense Refill  . DOXYCYCLINE HYCLATE 100 MG PO CAPS Oral Take 1 capsule (100 mg total) by mouth 2 (two) times daily. 20 capsule 0    BP 140/86  Pulse 99  Temp(Src) 98 F (36.7 C) (Oral)  Resp 20  Ht 5\' 2"  (1.575 m)  Wt 140 lb (63.504 kg)  BMI 25.61 kg/m2  SpO2 100%  LMP 04/06/2011  Physical Exam  Nursing note and vitals reviewed. Constitutional: She is oriented to person, place, and time. She appears well-developed and well-nourished. No distress.  HENT:  Head: Normocephalic and atraumatic.  Eyes: EOM are normal.  Neck: Normal range of motion.  Cardiovascular: Normal rate, regular rhythm and normal heart sounds.   Pulmonary/Chest: Effort normal and breath sounds normal.    Abdominal: Soft. She exhibits no distension. There is no tenderness.  Musculoskeletal: Normal range of motion.       Localizes the tick removal site as the R mid upper arm laterally.  No visible skin changes.  Neurological: She is alert and oriented to person, place, and time.  Skin: Skin is warm and dry.  Psychiatric: She has a normal mood and affect. Judgment normal.    ED Course  Procedures (including critical care time)  Labs Reviewed - No data to display No results found.   1. Tick bite       MDM  rx doxycycline 100 mg, BID, 20 F/u with MD of your choice as needed.        Worthy Rancher, PA 11/05/11 [phone]

## 2011-11-05 NOTE — ED Notes (Signed)
Pt. Discharged to home, NAD noted, pt.alert and oriented, ambulatory gait steady

## 2011-11-05 NOTE — ED Provider Notes (Signed)
Medical screening examination/treatment/procedure(s) were performed by non-physician practitioner and as supervising physician I was immediately available for consultation/collaboration.  Eliane Hammersmith S. Fuquan Wilson, MD 11/05/11 0727 

## 2012-03-11 ENCOUNTER — Other Ambulatory Visit (HOSPITAL_COMMUNITY)
Admission: RE | Admit: 2012-03-11 | Discharge: 2012-03-11 | Disposition: A | Discharge status: Home / Self Care | Payer: Medicaid Other | Source: Ambulatory Visit | Attending: Obstetrics and Gynecology | Admitting: Obstetrics and Gynecology

## 2012-03-11 DIAGNOSIS — Z01419 Encounter for gynecological examination (general) (routine) without abnormal findings: Secondary | ICD-10-CM | POA: Insufficient documentation

## 2012-03-11 DIAGNOSIS — Z113 Encounter for screening for infections with a predominantly sexual mode of transmission: Secondary | ICD-10-CM | POA: Insufficient documentation

## 2012-09-29 ENCOUNTER — Encounter: Payer: Self-pay | Admitting: *Deleted

## 2012-09-30 ENCOUNTER — Encounter: Payer: Self-pay | Admitting: *Deleted

## 2012-09-30 ENCOUNTER — Ambulatory Visit: Payer: Self-pay | Admitting: Adult Health

## 2012-10-11 ENCOUNTER — Encounter: Payer: Self-pay | Admitting: *Deleted

## 2012-11-04 ENCOUNTER — Encounter: Payer: Self-pay | Admitting: *Deleted

## 2012-11-04 ENCOUNTER — Ambulatory Visit: Payer: Self-pay | Admitting: Adult Health

## 2012-12-16 ENCOUNTER — Ambulatory Visit: Payer: Self-pay | Admitting: Adult Health

## 2012-12-22 ENCOUNTER — Ambulatory Visit: Payer: Self-pay | Admitting: Adult Health

## 2012-12-30 ENCOUNTER — Encounter: Payer: Self-pay | Admitting: Adult Health

## 2012-12-30 ENCOUNTER — Ambulatory Visit (INDEPENDENT_AMBULATORY_CARE_PROVIDER_SITE_OTHER): Payer: 59 | Admitting: Adult Health

## 2012-12-30 VITALS — BP 128/86 | Ht 62.0 in | Wt 151.0 lb

## 2012-12-30 DIAGNOSIS — N898 Other specified noninflammatory disorders of vagina: Secondary | ICD-10-CM

## 2012-12-30 DIAGNOSIS — N760 Acute vaginitis: Secondary | ICD-10-CM

## 2012-12-30 DIAGNOSIS — B9689 Other specified bacterial agents as the cause of diseases classified elsewhere: Secondary | ICD-10-CM | POA: Insufficient documentation

## 2012-12-30 LAB — POCT WET PREP (WET MOUNT)

## 2012-12-30 MED ORDER — METRONIDAZOLE 500 MG PO TABS: 500.0000 mg | ORAL_TABLET | Freq: Two times a day (BID) | ORAL | Status: DC

## 2012-12-30 NOTE — Patient Instructions (Addendum)
Bacterial Vaginosis Bacterial vaginosis (BV) is a vaginal infection where the normal balance of bacteria in the vagina is disrupted. The normal balance is then replaced by an overgrowth of certain bacteria. There are several different kinds of bacteria that can cause BV. BV is the most common vaginal infection in women of childbearing age. CAUSES   The cause of BV is not fully understood. BV develops when there is an increase or imbalance of harmful bacteria.  Some activities or behaviors can upset the normal balance of bacteria in the vagina and put women at increased risk including:  Having a new sex partner or multiple sex partners.  Douching.  Using an intrauterine device (IUD) for contraception.  It is not clear what role sexual activity plays in the development of BV. However, women that have never had sexual intercourse are rarely infected with BV. Women do not get BV from toilet seats, bedding, swimming pools or from touching objects around them.  SYMPTOMS   Grey vaginal discharge.  A fish-like odor with discharge, especially after sexual intercourse.  Itching or burning of the vagina and vulva.  Burning or pain with urination.  Some women have no signs or symptoms at all. DIAGNOSIS  Your caregiver must examine the vagina for signs of BV. Your caregiver will perform lab tests and look at the sample of vaginal fluid through a microscope. They will look for bacteria and abnormal cells (clue cells), a pH test higher than 4.5, and a positive amine test all associated with BV.  RISKS AND COMPLICATIONS   Pelvic inflammatory disease (PID).  Infections following gynecology surgery.  Developing HIV.  Developing herpes virus. TREATMENT  Sometimes BV will clear up without treatment. However, all women with symptoms of BV should be treated to avoid complications, especially if gynecology surgery is planned. Female partners generally do not need to be treated. However, BV may spread  between female sex partners so treatment is helpful in preventing a recurrence of BV.   BV may be treated with antibiotics. The antibiotics come in either pill or vaginal cream forms. Either can be used with nonpregnant or pregnant women, but the recommended dosages differ. These antibiotics are not harmful to the baby.  BV can recur after treatment. If this happens, a second round of antibiotics will often be prescribed.  Treatment is important for pregnant women. If not treated, BV can cause a premature delivery, especially for a pregnant woman who had a premature birth in the past. All pregnant women who have symptoms of BV should be checked and treated.  For chronic reoccurrence of BV, treatment with a type of prescribed gel vaginally twice a week is helpful. HOME CARE INSTRUCTIONS   Finish all medication as directed by your caregiver.  Do not have sex until treatment is completed.  Tell your sexual partner that you have a vaginal infection. They should see their caregiver and be treated if they have problems, such as a mild rash or itching.  Practice safe sex. Use condoms. Only have 1 sex partner. PREVENTION  Basic prevention steps can help reduce the risk of upsetting the natural balance of bacteria in the vagina and developing BV:  Do not have sexual intercourse (be abstinent).  Do not douche.  Use all of the medicine prescribed for treatment of BV, even if the signs and symptoms go away.  Tell your sex partner if you have BV. That way, they can be treated, if needed, to prevent reoccurrence. SEEK MEDICAL CARE IF:     Your symptoms are not improving after 3 days of treatment.  You have increased discharge, pain, or fever. MAKE SURE YOU:   Understand these instructions.  Will watch your condition.  Will get help right away if you are not doing well or get worse. FOR MORE INFORMATION  Division of STD Prevention (DSTDP), Centers for Disease Control and Prevention:  www.cdc.gov/std American Social Health Association (ASHA): www.ashastd.org  Document Released: 06/09/2005 Document Revised: 09/01/2011 Document Reviewed: 11/30/2008 [employer] Patient Information 2014 [employer]. Take flagyl no alcohol  Follow up prn 

## 2012-12-30 NOTE — Progress Notes (Signed)
Subjective:     Patient ID: Michele Phillips, female   DOB: 08-Jun-1991, 22 y.o.   MRN: [id number]  HPI Johnny is a 22 year old black female in today complaining of vaginal odor. She has an implanon, and is happy with it.It is easily palpated in the left arm.  Review of Systems Positives in HPI Reviewed past medical,surgical, social and family history. Reviewed medications and allergies.     Objective:   Physical Exam BP 128/86  Ht 5\' 2"  (1.575 m)  Wt 151 lb (68.493 kg)  BMI 27.61 kg/m2  LMP 12/18/2012  Breastfeeding? No Skin warm and dry.Pelvic: external genitalia is normal in appearance, vagina: white discharge with odor, cervix:smooth and bulbous, uterus: normal size, shape and contour, non tender, no masses felt, adnexa: no masses or tenderness noted. Wet prep: + for clue cells and +WBCs. GC/CHL obtained.     Assessment:      Vaginal discharge and odor BV    Plan:      Rx flagyl 500 mg 1 bid x 7 days    Follow up GC/CHl in am Review handout on BV

## 2012-12-31 LAB — GC/CHLAMYDIA PROBE AMP
CT Probe RNA: NEGATIVE
GC Probe RNA: NEGATIVE

## 2013-01-04 ENCOUNTER — Telehealth: Payer: Self-pay | Admitting: Adult Health

## 2013-01-04 NOTE — Telephone Encounter (Signed)
Voice mailbox not set up at 5:09pm. JSY

## 2013-01-05 ENCOUNTER — Telehealth: Payer: Self-pay | Admitting: Adult Health

## 2013-01-05 NOTE — Telephone Encounter (Signed)
Left message. JSY 

## 2013-01-05 NOTE — Telephone Encounter (Signed)
Spoke with pt. GC/CHL was negative. Pt aware. JSY

## 2013-02-03 NOTE — Telephone Encounter (Signed)
Done

## 2013-07-13 ENCOUNTER — Other Ambulatory Visit: Payer: 59

## 2013-12-28 ENCOUNTER — Other Ambulatory Visit (INDEPENDENT_AMBULATORY_CARE_PROVIDER_SITE_OTHER): Payer: Medicaid Other | Admitting: Adult Health

## 2014-02-01 ENCOUNTER — Other Ambulatory Visit: Payer: Medicaid Other | Admitting: Adult Health

## 2014-04-20 ENCOUNTER — Other Ambulatory Visit (HOSPITAL_COMMUNITY): Payer: Self-pay | Admitting: *Deleted

## 2014-04-20 DIAGNOSIS — IMO0002 Reserved for concepts with insufficient information to code with codable children: Secondary | ICD-10-CM

## 2014-04-20 DIAGNOSIS — R229 Localized swelling, mass and lump, unspecified: Principal | ICD-10-CM

## 2014-04-24 ENCOUNTER — Encounter: Payer: Self-pay | Admitting: Adult Health

## 2014-04-25 ENCOUNTER — Other Ambulatory Visit (HOSPITAL_COMMUNITY): Payer: Self-pay | Admitting: Diagnostic Radiology

## 2014-04-25 ENCOUNTER — Ambulatory Visit (HOSPITAL_COMMUNITY)
Admission: RE | Admit: 2014-04-25 | Discharge: 2014-04-25 | Disposition: A | Discharge status: Home / Self Care | Payer: PRIVATE HEALTH INSURANCE | Source: Ambulatory Visit | Attending: *Deleted | Admitting: *Deleted

## 2014-04-25 DIAGNOSIS — N63 Unspecified lump in breast: Secondary | ICD-10-CM | POA: Insufficient documentation

## 2014-04-25 DIAGNOSIS — IMO0002 Reserved for concepts with insufficient information to code with codable children: Secondary | ICD-10-CM

## 2014-04-25 DIAGNOSIS — R229 Localized swelling, mass and lump, unspecified: Secondary | ICD-10-CM

## 2014-04-25 DIAGNOSIS — N6310 Unspecified lump in the right breast, unspecified quadrant: Secondary | ICD-10-CM

## 2014-04-26 ENCOUNTER — Other Ambulatory Visit (HOSPITAL_COMMUNITY): Payer: Self-pay | Admitting: *Deleted

## 2014-04-26 DIAGNOSIS — N6310 Unspecified lump in the right breast, unspecified quadrant: Secondary | ICD-10-CM

## 2014-05-09 ENCOUNTER — Other Ambulatory Visit (HOSPITAL_COMMUNITY): Payer: Self-pay

## 2014-05-16 ENCOUNTER — Other Ambulatory Visit (HOSPITAL_COMMUNITY): Payer: Self-pay | Admitting: *Deleted

## 2014-05-16 ENCOUNTER — Encounter (HOSPITAL_COMMUNITY): Payer: Self-pay

## 2014-05-16 ENCOUNTER — Ambulatory Visit (HOSPITAL_COMMUNITY)
Admission: RE | Admit: 2014-05-16 | Discharge: 2014-05-16 | Disposition: A | Discharge status: Home / Self Care | Payer: PRIVATE HEALTH INSURANCE | Source: Ambulatory Visit | Attending: *Deleted | Admitting: *Deleted

## 2014-05-16 ENCOUNTER — Ambulatory Visit (HOSPITAL_COMMUNITY)
Admission: RE | Admit: 2014-05-16 | Discharge: 2014-05-16 | Disposition: A | Discharge status: Home / Self Care | Payer: PRIVATE HEALTH INSURANCE | Source: Ambulatory Visit | Attending: Diagnostic Radiology | Admitting: Diagnostic Radiology

## 2014-05-16 DIAGNOSIS — N6310 Unspecified lump in the right breast, unspecified quadrant: Secondary | ICD-10-CM

## 2014-05-16 DIAGNOSIS — D241 Benign neoplasm of right breast: Secondary | ICD-10-CM | POA: Insufficient documentation

## 2014-05-16 MED ORDER — LIDOCAINE HCL (PF) 2 % IJ SOLN
INTRAMUSCULAR | Status: AC
  Administered 2014-05-16: 200 mg
  Filled 2014-05-16: qty 10

## 2014-05-16 NOTE — Discharge Instructions (Signed)
Breast Biopsy  Care After Refer to this sheet in the next few weeks. These instructions provide you with information on caring for yourself after your procedure. Your caregiver may also give you more specific instructions. Your treatment has been planned according to current medical practices, but problems sometimes occur. Call your caregiver if you have any problems or questions after your procedure. HOME CARE INSTRUCTIONS   Only take over-the-counter or prescription medicines for pain, discomfort, or fever as directed by your caregiver.  Do not take aspirin. It can cause bleeding.  Keep stitches dry when bathing.  Protect the biopsy area. Do not let the area get bumped.  Avoid activities that may pull the incision site open until approved by your caregiver. This can include stretching, reaching, exercise, sports, or lifting over 3 pounds.  Resume your usual diet.  Wear a good support bra for as long as directed by your caregiver.  Change any bandages (dressings) as directed by your caregiver.  Do not drink alcohol while taking pain medicine.  Keep all your follow-up appointments with your caregiver. Ask when your test results will be ready. Make sure you get your test results. SEEK MEDICAL CARE IF:   You have redness, swelling, or increasing pain in the biopsy site.  You have a bad smell coming from the biopsy site or dressing.  Your biopsy site breaks open after the stitches (sutures), staples, or skin adhesive strips have been removed.  You have a rash.  You need stronger medicine. SEEK IMMEDIATE MEDICAL CARE IF:   You have a fever.  You have increased bleeding (more than a small spot) from the biopsy site.  You have difficulty breathing.  You have pus coming from the biopsy site. MAKE SURE YOU:  Understand these instructions.  Will watch your condition.  Will get help right away if you are not doing well or get worse. Document Released: 12/27/2004 Document  Revised: 09/01/2011 Document Reviewed: 07/10/2011 [facility] Patient Information 2015 [place], [place]. This information is not intended to replace advice given to you by your health care provider. Make sure you discuss any questions you have with your health care provider.  Breast Biopsy A breast biopsy is a procedure where a sample of breast tissue is removed from your breast. The tissue is examined under a microscope to see if cancerous cells are present. A breast biopsy is done when there is:  Any undiagnosed breast mass (tumor).  Nipple abnormalities, dimpling, crusting, or ulcerations.  Abnormal discharge from the nipple, especially blood.  Redness, swelling, and pain of the breast.  Calcium deposits (calcifications) or abnormalities seen on a mammogram, ultrasound result, or results of magnetic resonance imaging (MRI).  Suspicious changes in the breast seen on your mammogram. If the tumor is found to be cancerous (malignant), a breast biopsy can help to determine what the best treatment is for you. There are many different types of breast biopsies. Talk to your caregiver about your options and which type is best for you. LET YOUR CAREGIVER KNOW ABOUT:  Allergies to food or medicine.  Medicines taken, including vitamins, herbs, eyedrops, over-the-counter medicines, and creams.  Use of steroids (by mouth or creams).  Previous problems with anesthetics or numbing medicines.  History of bleeding problems or blood clots.  Previous surgery.  Other health problems, including diabetes and kidney problems.  Any recent colds or infections.  Possibility of pregnancy, if this applies. RISKS AND COMPLICATIONS   Bleeding.  Infection.  Allergy to medicines.  Bruising and  swelling of the breast.  Alteration in the shape of the breast.  Not finding the lump or abnormality.  Needing more surgery. BEFORE THE PROCEDURE  Arrange for someone to drive you home after the  procedure.  Do not smoke for 2 weeks before the procedure. Stop smoking, if you smoke.  Do not drink alcohol for 24 hours before procedure.  Wear a good support bra to the procedure. PROCEDURE  You may be given a medicine to numb the breast area (local anesthesia) or a medicine to make you sleep (general anesthesia) during the procedure. The following are the different types of biopsies that can be performed.   Fine-needle aspiration--A thin needle is attached to a syringe and inserted into the breast lump. Fluid and cells are removed and then looked at under a microscope. If the breast lump cannot be felt, an ultrasound may be used to help locate the lump and place the needle in the correct area.   Core needle biopsy--A wide, hollow needle (core needle) is inserted into the breast lump 3-6 times to get tissue samples or cores. The samples are removed. The needle is usually placed in the correct area by using an ultrasound or X-ray.   Stereotactic biopsy--X-ray equipment and a computer are used to analyze X-ray pictures of the breast lump. The computer then finds exactly where the core needle needs to be inserted. Tissue samples are removed.   Vacuum-assisted biopsy--A small incision (less than  inch) is made in your breast. A biopsy device that includes a hollow needle and vacuum is passed through the incision and into the breast tissue. The vacuum gently draws abnormal breast tissue into the needle to remove it. This type of biopsy removes a larger tissue sample than a regular core needle biopsy. No stitches are needed, and there is usually little scarring.  Ultrasound-guided core needle biopsy--A high frequency ultrasound helps guide the core needle to the area of the mass or abnormality. An incision is made to insert the needle. Tissue samples are removed.  Open biopsy--A larger incision is made in the breast. Your caregiver will attempt to remove the whole breast lump or as much as  possible. AFTER THE PROCEDURE  You will be taken to the recovery area. If you are doing well and have no problems, you will be allowed to go home.  You may notice bruising on your breast. This is normal.  Your caregiver may apply a pressure dressing on your breast for 24-48 hours. A pressure dressing is a bandage that is wrapped tightly around the chest to stop fluid from collecting underneath tissues. Document Released: 06/09/2005 Document Revised: 10/04/2012 Document Reviewed: 07/10/2011 [facility] Patient Information 2015 [place], [place]. This information is not intended to replace advice given to you by your health care provider. Make sure you discuss any questions you have with your health care provider.

## 2014-05-16 NOTE — Sedation Documentation (Signed)
Procedure underway, pt has no questions for the MD at this time.

## 2014-07-19 ENCOUNTER — Ambulatory Visit (HOSPITAL_COMMUNITY): Payer: Self-pay | Admitting: Psychology

## 2014-10-30 ENCOUNTER — Other Ambulatory Visit (HOSPITAL_COMMUNITY): Payer: Self-pay | Admitting: *Deleted

## 2014-10-30 DIAGNOSIS — Z09 Encounter for follow-up examination after completed treatment for conditions other than malignant neoplasm: Secondary | ICD-10-CM

## 2014-11-28 ENCOUNTER — Ambulatory Visit (HOSPITAL_COMMUNITY)
Admission: RE | Admit: 2014-11-28 | Discharge: 2014-11-28 | Disposition: A | Discharge status: Home / Self Care | Payer: BLUE CROSS/BLUE SHIELD | Source: Ambulatory Visit | Attending: *Deleted | Admitting: *Deleted

## 2014-11-28 DIAGNOSIS — Z09 Encounter for follow-up examination after completed treatment for conditions other than malignant neoplasm: Secondary | ICD-10-CM | POA: Diagnosis present

## 2014-11-28 DIAGNOSIS — D241 Benign neoplasm of right breast: Secondary | ICD-10-CM | POA: Diagnosis not present

## 2015-05-29 ENCOUNTER — Other Ambulatory Visit (HOSPITAL_COMMUNITY): Payer: Self-pay | Admitting: *Deleted

## 2015-05-29 DIAGNOSIS — Z09 Encounter for follow-up examination after completed treatment for conditions other than malignant neoplasm: Secondary | ICD-10-CM

## 2015-06-05 ENCOUNTER — Ambulatory Visit (HOSPITAL_COMMUNITY)
Admission: RE | Admit: 2015-06-05 | Discharge: 2015-06-05 | Disposition: A | Discharge status: Home / Self Care | Payer: PRIVATE HEALTH INSURANCE | Source: Ambulatory Visit | Attending: *Deleted | Admitting: *Deleted

## 2015-06-05 DIAGNOSIS — Z09 Encounter for follow-up examination after completed treatment for conditions other than malignant neoplasm: Secondary | ICD-10-CM | POA: Insufficient documentation

## 2015-06-05 DIAGNOSIS — N63 Unspecified lump in breast: Secondary | ICD-10-CM | POA: Diagnosis not present

## 2015-11-20 ENCOUNTER — Other Ambulatory Visit (HOSPITAL_COMMUNITY): Payer: Self-pay | Admitting: *Deleted

## 2015-11-20 DIAGNOSIS — Z09 Encounter for follow-up examination after completed treatment for conditions other than malignant neoplasm: Secondary | ICD-10-CM

## 2015-11-27 ENCOUNTER — Other Ambulatory Visit (HOSPITAL_COMMUNITY): Payer: Self-pay

## 2015-12-11 ENCOUNTER — Other Ambulatory Visit (HOSPITAL_COMMUNITY): Payer: Self-pay

## 2015-12-18 ENCOUNTER — Ambulatory Visit (HOSPITAL_COMMUNITY)
Admission: RE | Admit: 2015-12-18 | Discharge: 2015-12-18 | Disposition: A | Discharge status: Home / Self Care | Payer: PRIVATE HEALTH INSURANCE | Source: Ambulatory Visit | Attending: *Deleted | Admitting: *Deleted

## 2015-12-18 DIAGNOSIS — D241 Benign neoplasm of right breast: Secondary | ICD-10-CM | POA: Insufficient documentation

## 2015-12-18 DIAGNOSIS — Z09 Encounter for follow-up examination after completed treatment for conditions other than malignant neoplasm: Secondary | ICD-10-CM

## 2016-06-05 ENCOUNTER — Other Ambulatory Visit (HOSPITAL_COMMUNITY): Payer: Self-pay | Admitting: *Deleted

## 2016-06-05 DIAGNOSIS — IMO0002 Reserved for concepts with insufficient information to code with codable children: Secondary | ICD-10-CM

## 2016-06-05 DIAGNOSIS — R229 Localized swelling, mass and lump, unspecified: Principal | ICD-10-CM

## 2016-06-24 ENCOUNTER — Ambulatory Visit (HOSPITAL_COMMUNITY): Admission: RE | Admit: 2016-06-24 | Payer: PRIVATE HEALTH INSURANCE | Source: Ambulatory Visit

## 2016-09-18 ENCOUNTER — Other Ambulatory Visit (HOSPITAL_COMMUNITY): Payer: Self-pay | Admitting: *Deleted

## 2016-09-18 DIAGNOSIS — D241 Benign neoplasm of right breast: Secondary | ICD-10-CM

## 2016-09-18 DIAGNOSIS — Z09 Encounter for follow-up examination after completed treatment for conditions other than malignant neoplasm: Secondary | ICD-10-CM

## 2016-09-23 ENCOUNTER — Ambulatory Visit (HOSPITAL_COMMUNITY)
Admission: RE | Admit: 2016-09-23 | Discharge: 2016-09-23 | Disposition: A | Discharge status: Home / Self Care | Payer: PRIVATE HEALTH INSURANCE | Source: Ambulatory Visit | Attending: *Deleted | Admitting: *Deleted

## 2016-09-23 DIAGNOSIS — D241 Benign neoplasm of right breast: Secondary | ICD-10-CM | POA: Diagnosis not present

## 2016-09-23 DIAGNOSIS — Z09 Encounter for follow-up examination after completed treatment for conditions other than malignant neoplasm: Secondary | ICD-10-CM | POA: Diagnosis present

## 2016-11-16 IMAGING — US US BREAST LTD UNI RIGHT INC AXILLA
1 series · 11 of 11 positions shown · non-contrast
Comparison: Previous exam(s).

CLINICAL DATA: Follow-up of probably benign right breast 11 o'clock
masses. The larger of the 2 masses is status post ultrasound-guided
core needle biopsy with pathology demonstrating fibroadenoma.

EXAM:
ULTRASOUND OF THE RIGHT BREAST

[Series 1: us breast ltd uni right inc axilla · 0.07mm/px · 11 of 11 slices shown]
[im 1/11]
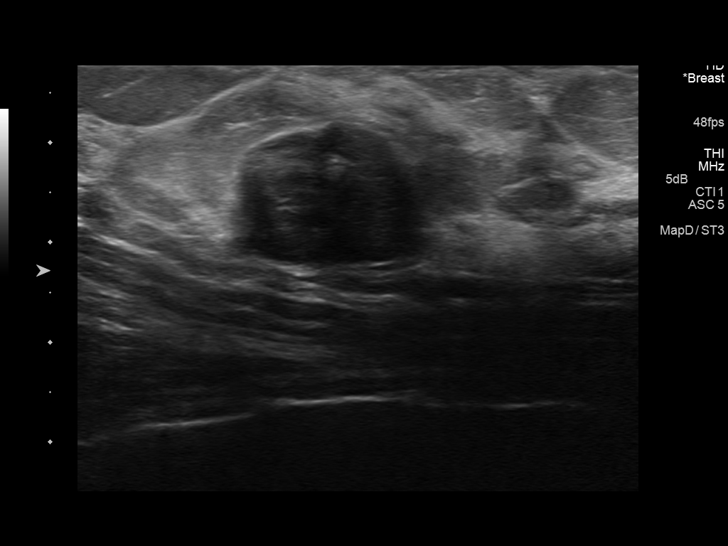
[im 2/11]
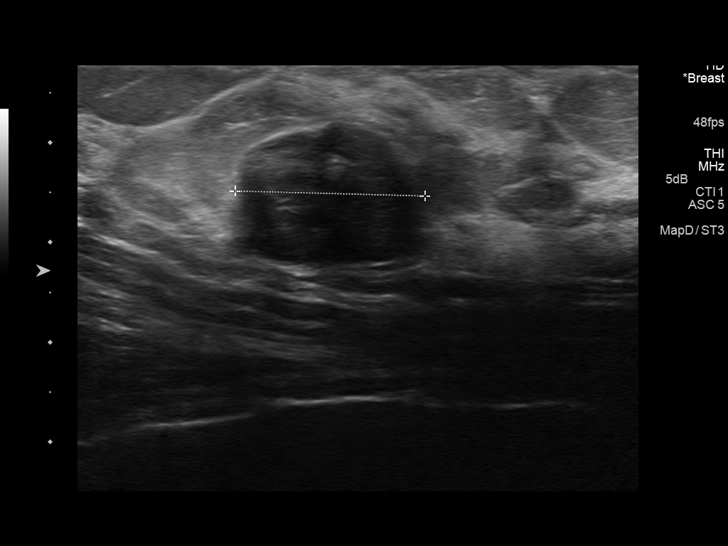
[im 3/11]
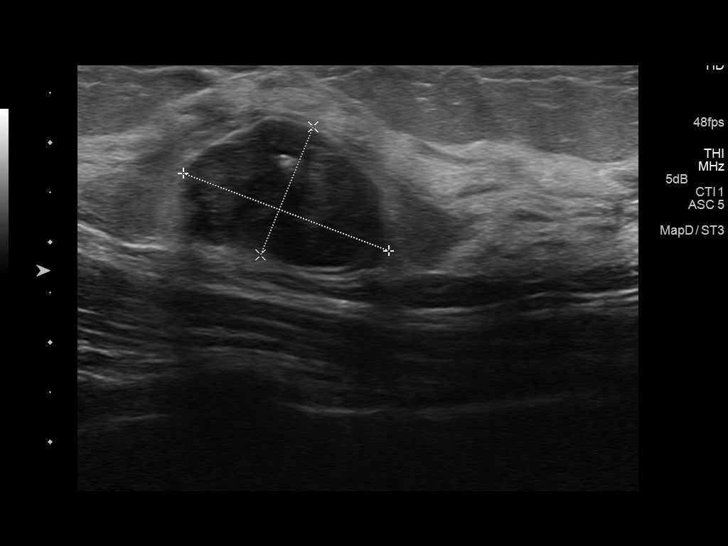
[im 4/11]
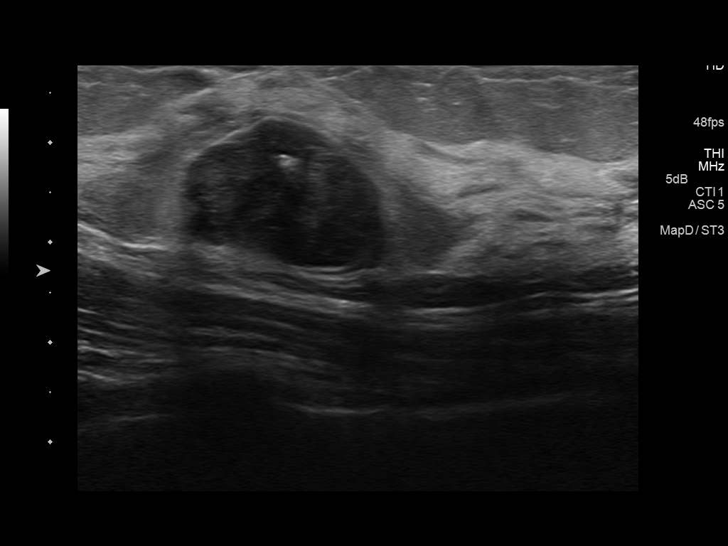
[im 5/11]
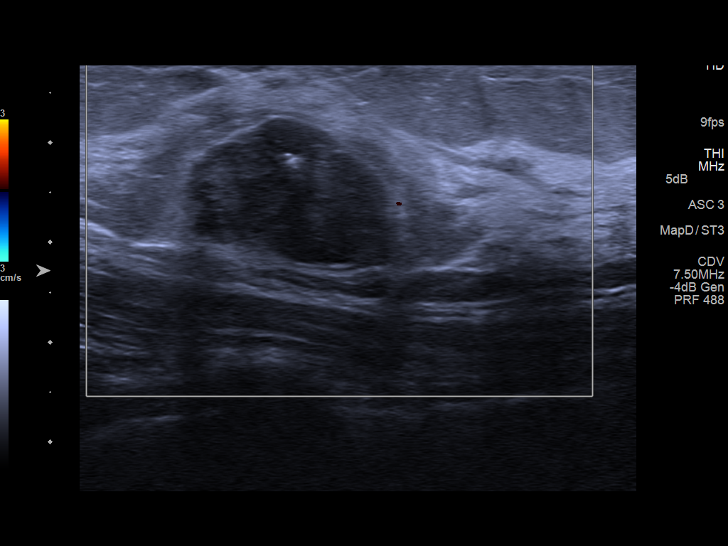
[im 6/11]
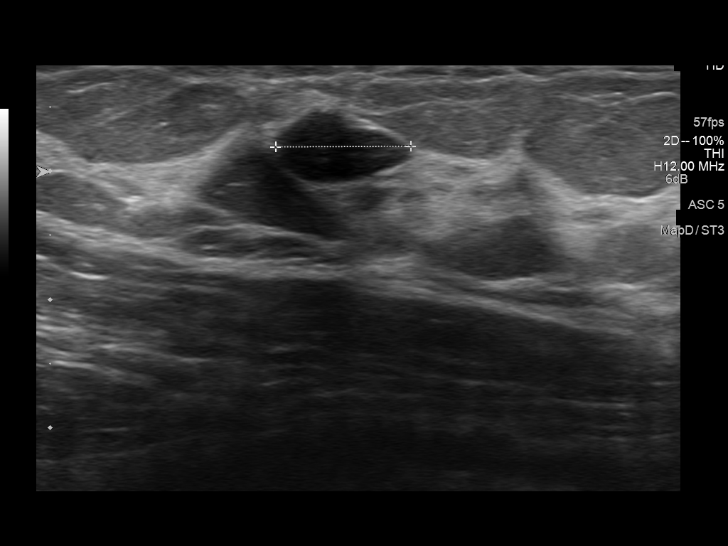
[im 7/11]
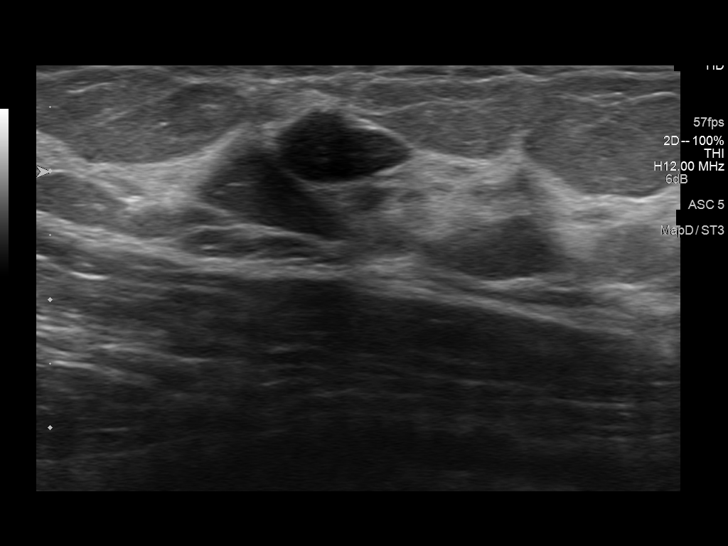
[im 8/11]
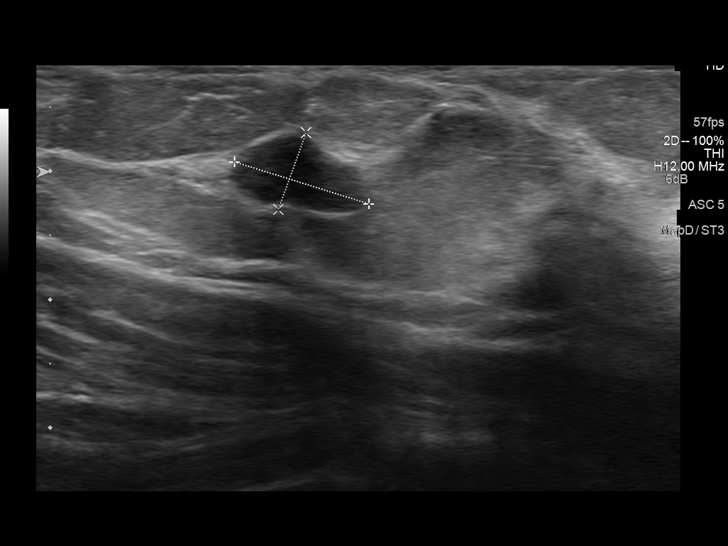
[im 9/11]
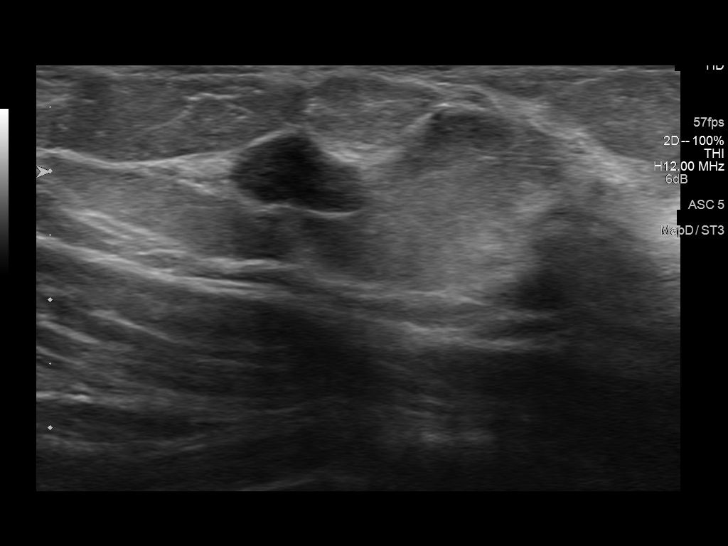
[im 10/11]
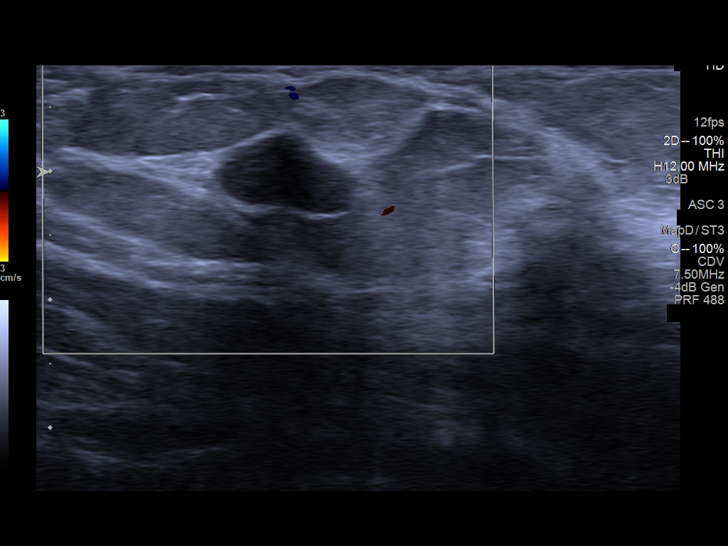
[im 11/11]
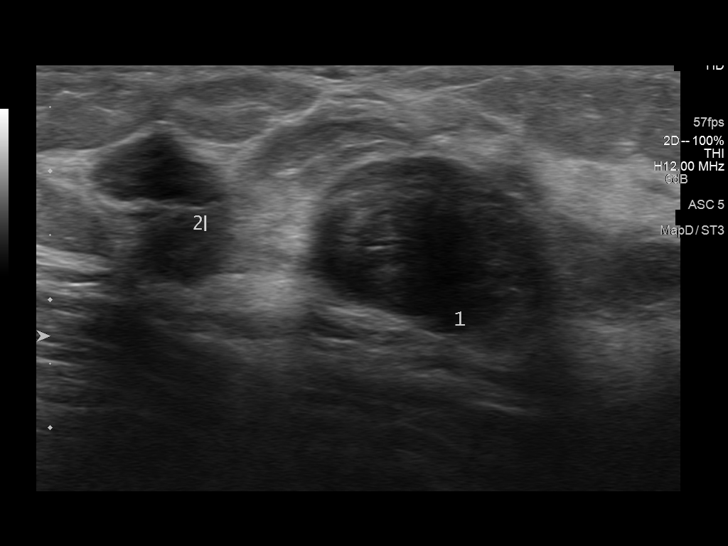

[11 of 11 positions shown; findings below may reference images not displayed]

FINDINGS: On physical exam, there is a palpable mobile mass in the right 11
o'clock breast.

Targeted ultrasound is performed, showing right breast 11 o'clock 10
cm from the nipple 2 adjacent hypoechoic circumscribed masses. The
larger mass measures 1.9 x 2.2 x 1.4 cm, and contains a post biopsy
metallic marker, biopsy-proven fibroadenoma. The smaller mass
measures 0.6 by 1.1 by 1.1, and also has the appearance of a
fibroadenoma. It has slightly increased in size from the prior
measurements of 0.6 by 0.9 by 1.2 cm, which may be accounted for by
differences in measurement technique.
IMPRESSION: Two right breast 11 o'clock masses with benign sonographic
appearance. The larger of the 2 masses is status post core needle
biopsy demonstrating fibroadenoma.

Further six-month follow-up is recommended for the smaller of the 2
masses.

RECOMMENDATION:
Ultrasound follow-up in 6 months of the right breast.

I have discussed the findings and recommendations with the patient.
Results were also provided in writing at the conclusion of the
visit. If applicable, a reminder letter will be sent to the patient
regarding the next appointment.

BI-RADS CATEGORY  3: Probably benign finding(s) - short interval
follow-up suggested.

## 2017-05-31 IMAGING — US ULTRASOUND RIGHT BREAST LIMITED
1 series · 12 of 12 positions shown · non-contrast
Comparison: Previous exam(s).

CLINICAL DATA: Followup probable fibroadenoma in the 11 o'clock
position of the right breast. An adjacent larger mass was previously
biopsied and shown to be a fibroadenoma.

EXAM:
ULTRASOUND OF THE RIGHT BREAST

[Series 1: ultrasound right breast limited · 0.06mm/px · 12 of 12 slices shown]
[im 1/12]
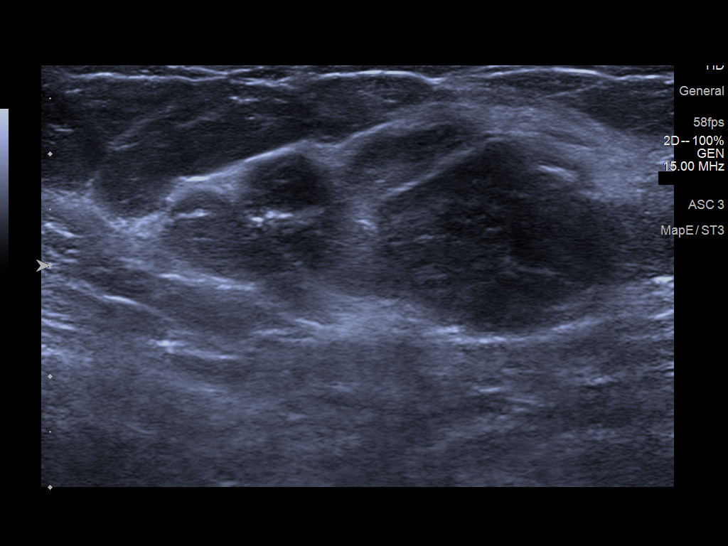
[im 2/12]
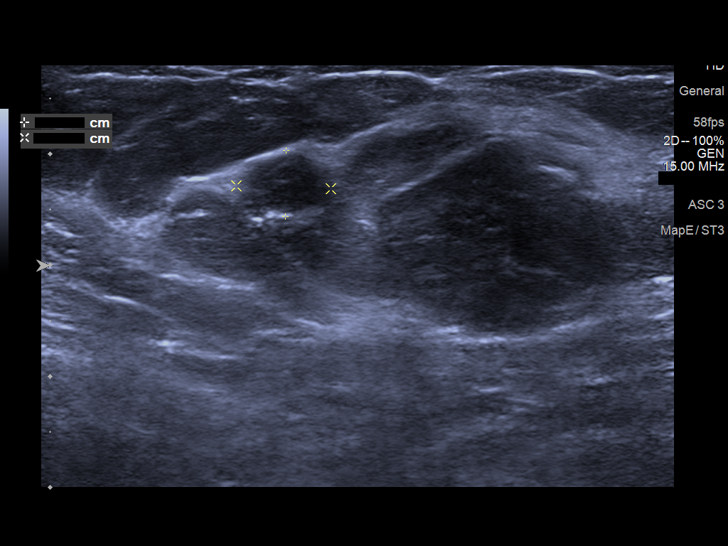
[im 3/12]
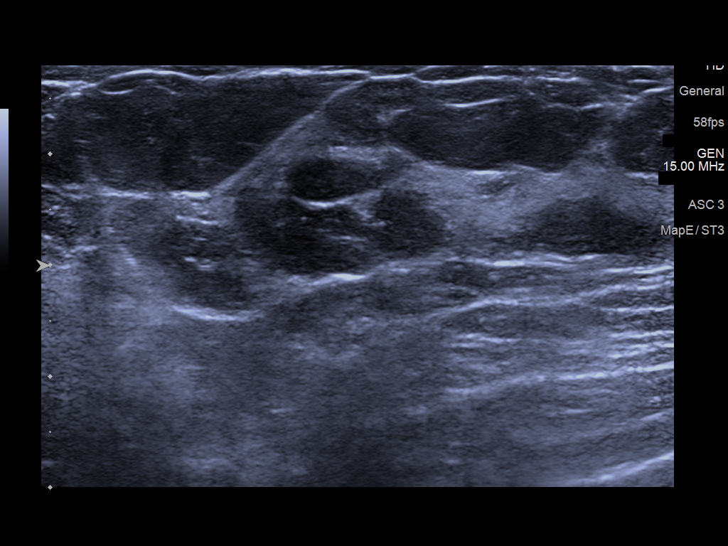
[im 4/12]
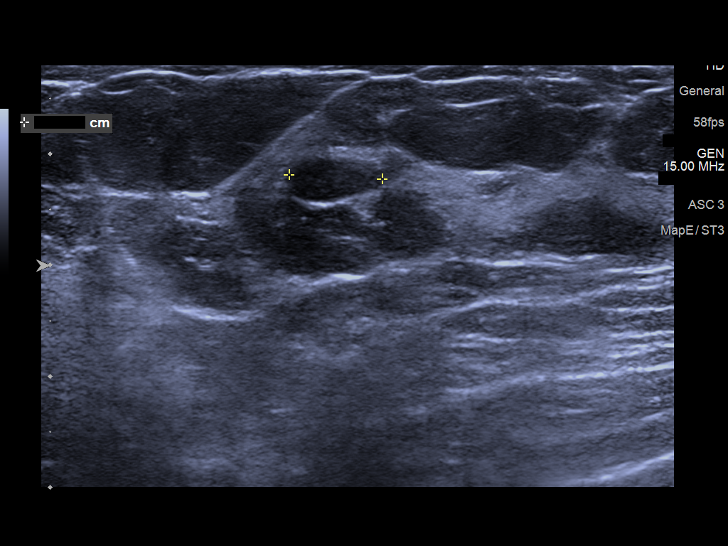
[im 5/12]
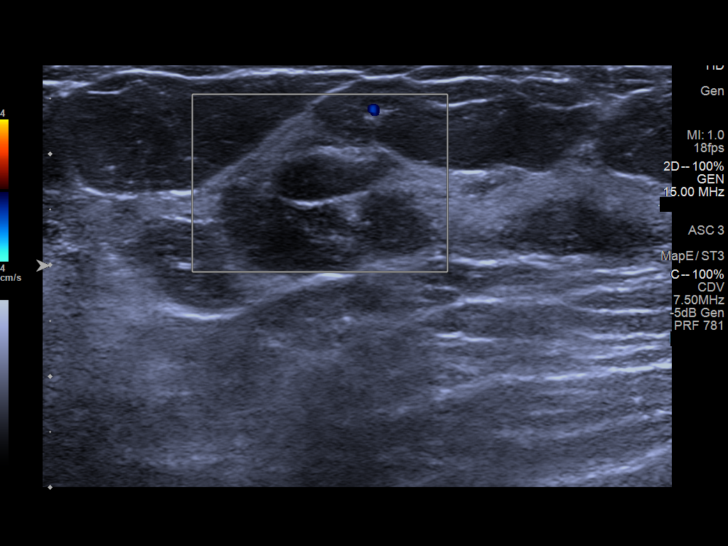
[im 6/12]
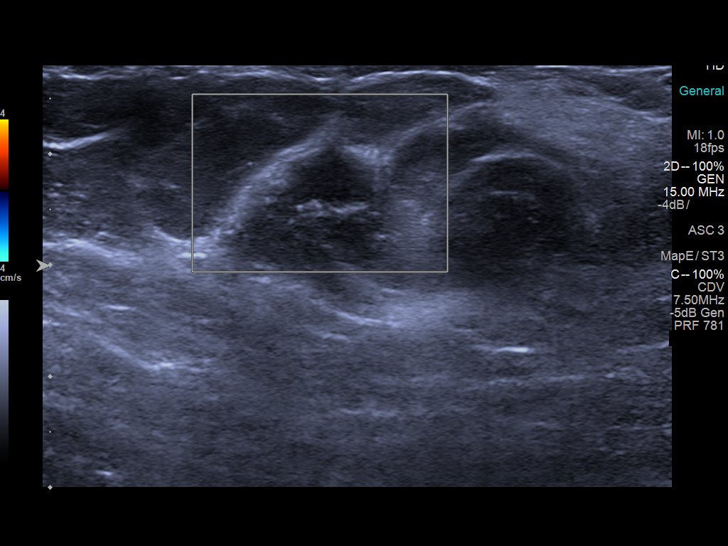
[im 7/12]
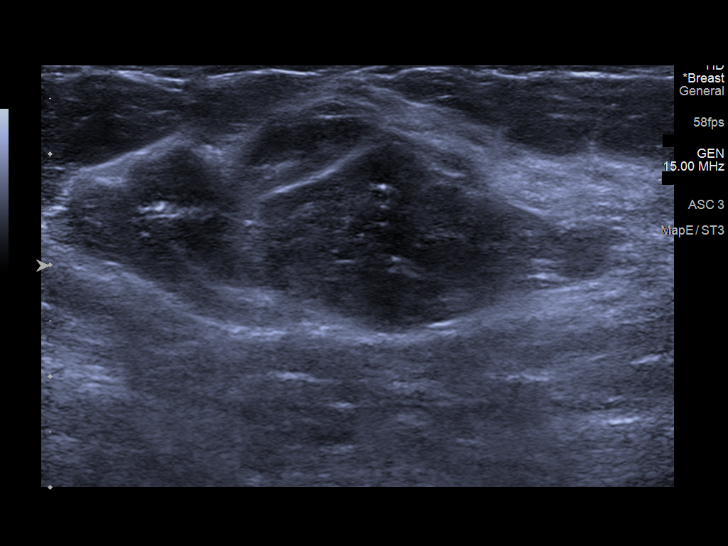
[im 8/12]
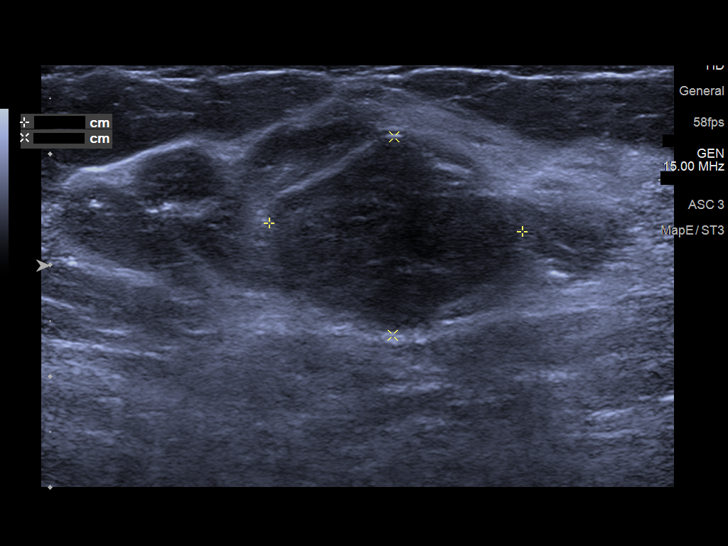
[im 9/12]
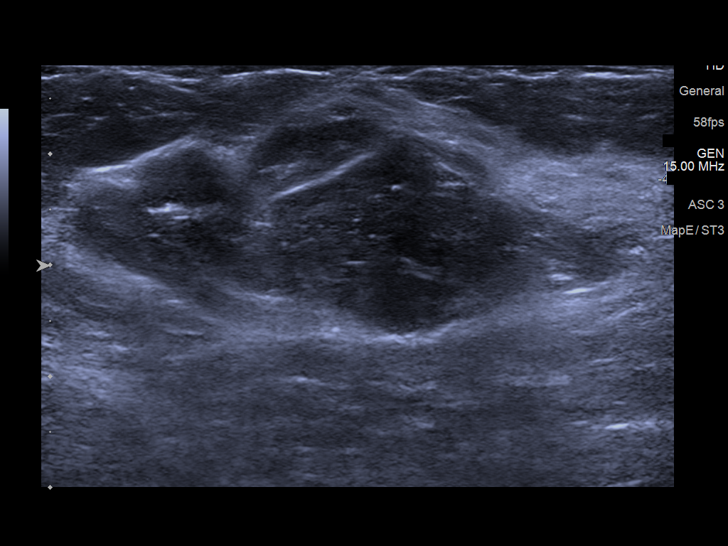
[im 10/12]
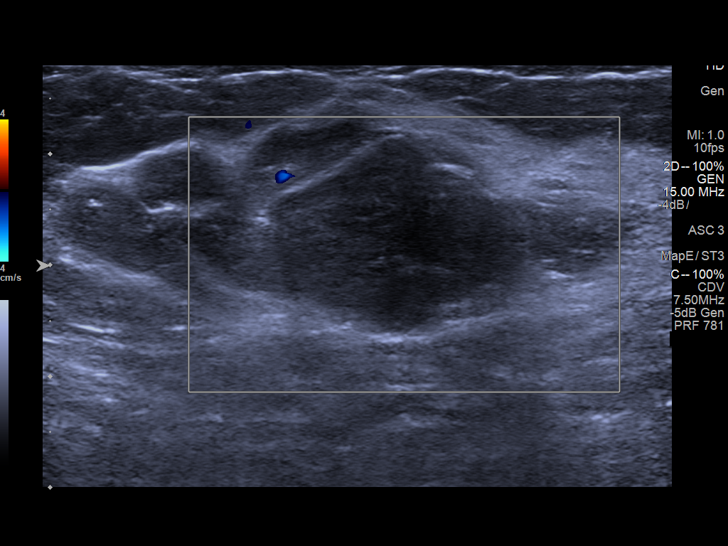
[im 11/12]
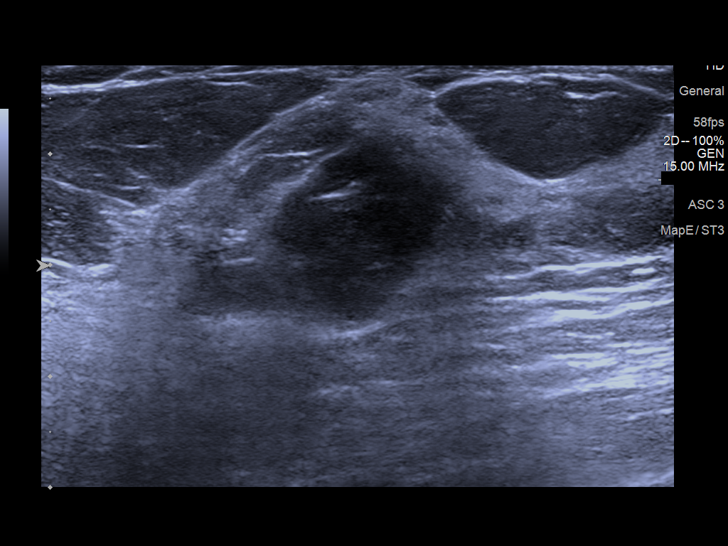
[im 12/12]
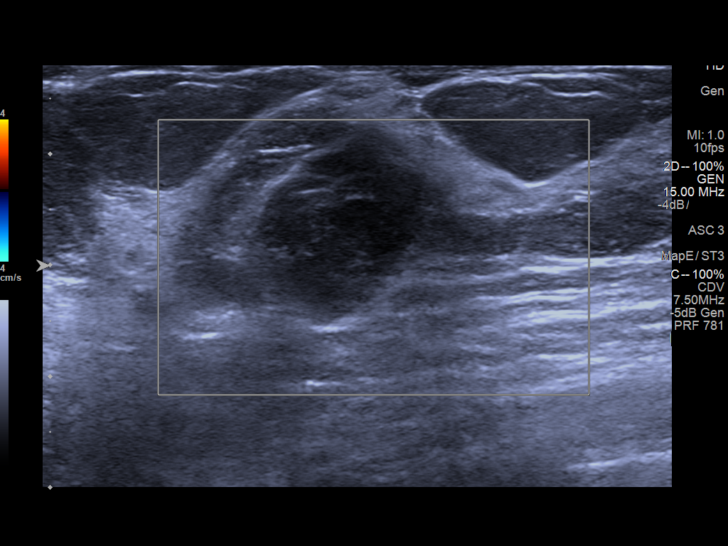

[12 of 12 positions shown; findings below may reference images not displayed]

FINDINGS: On physical exam, the patient has an approximately 1 cm oval,
palpable mass in the 11 o'clock position of the right breast, 10 cm
from the nipple.

Targeted ultrasound is performed, showing a 2.3 x 1.8 x 1.5 cm oval,
horizontally oriented, circumscribed hypoechoic mass in the 11
o'clock position of the right breast, 10 cm from the nipple. This
contains a biopsy marker clip and corresponds to the previously
biopsied fibroadenoma. This measured 2.2 x 1.9 x 1.4 cm on
06/05/2015 and initially measured 3.1 x 2.3 x 2.0 cm on 04/25/2014.

Slightly more superiorly, and adjacent 9 x 8 x 6 mm oval,
horizontally oriented, hypoechoic, circumscribed mass is
demonstrated. This measured 11 x 11 x 6 mm 06/05/2015 and 11 x 9 x 5
mm in corresponding dimensions when initially imaged on 04/25/2014.
The measurements in the report from 04/25/2014 are larger due to
inclusion of an underlying fat lobule at that time.
IMPRESSION: No significant change in a biopsy-proven fibroadenoma and slight
interval decrease in size of an adjacent probable fibroadenoma in
the 11 o'clock position of the right breast.

RECOMMENDATION:
Right breast ultrasound in 6 months to complete 2 year follow-up of
the probable fibroadenoma adjacent to the biopsy proven fibroadenoma
in the 11 o'clock position of the right breast.

I have discussed the findings and recommendations with the patient.
Results were also provided in writing at the conclusion of the
visit. If applicable, a reminder letter will be sent to the patient
regarding the next appointment.

BI-RADS CATEGORY  3: Probably benign.

## 2018-03-07 IMAGING — US ULTRASOUND RIGHT BREAST LIMITED
1 series · 13 of 16 positions shown · non-contrast
Comparison: Previous exams including diagnostic ultrasounds dated
12/18/2015, 05/26/2015 and 11/28/2014.

CLINICAL DATA: Biopsy-proven fibroadenoma within the right breast.
Adjacent smaller probably benign mass, presumed additional
fibroadenoma

EXAM:
ULTRASOUND OF THE RIGHT BREAST

[Series 1: ultrasound right breast limited · 0.07mm/px · 13 of 16 slices shown]
[im 1/16]
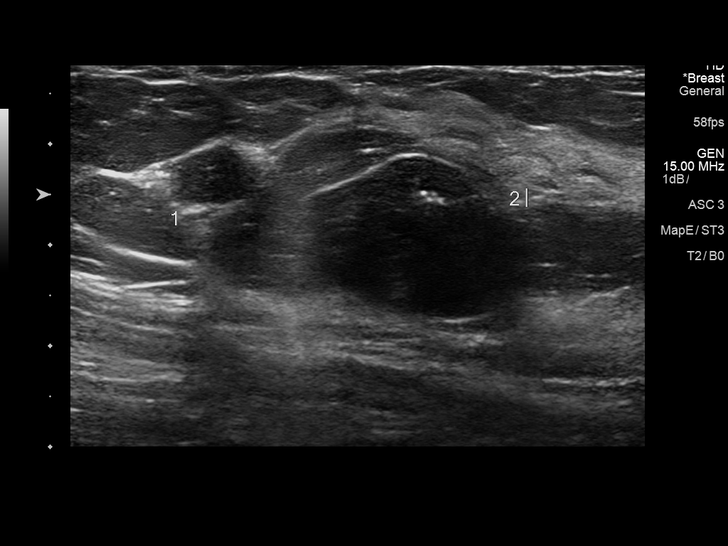
[im 2/16]
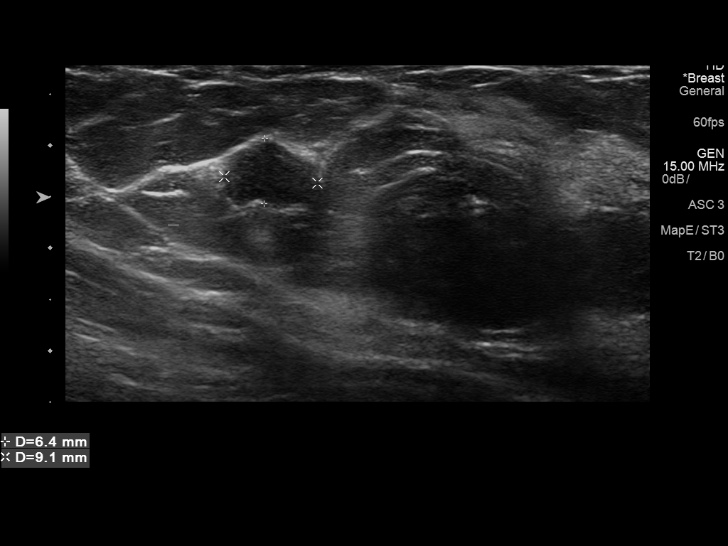
[im 4/16]
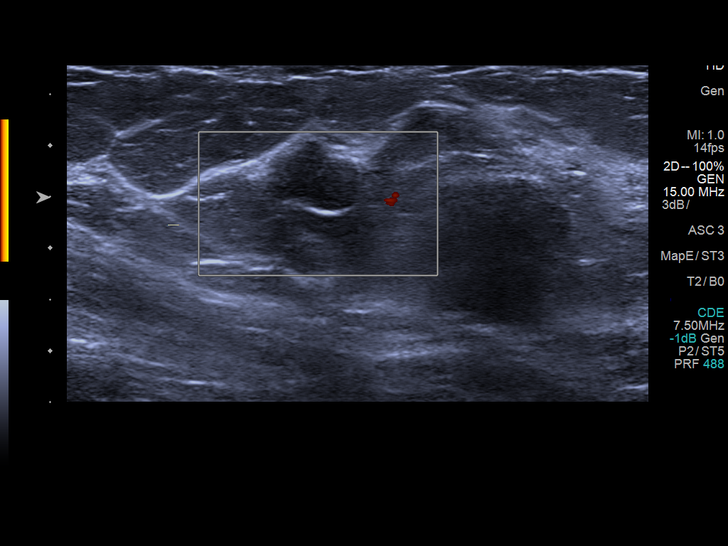
[im 5/16]
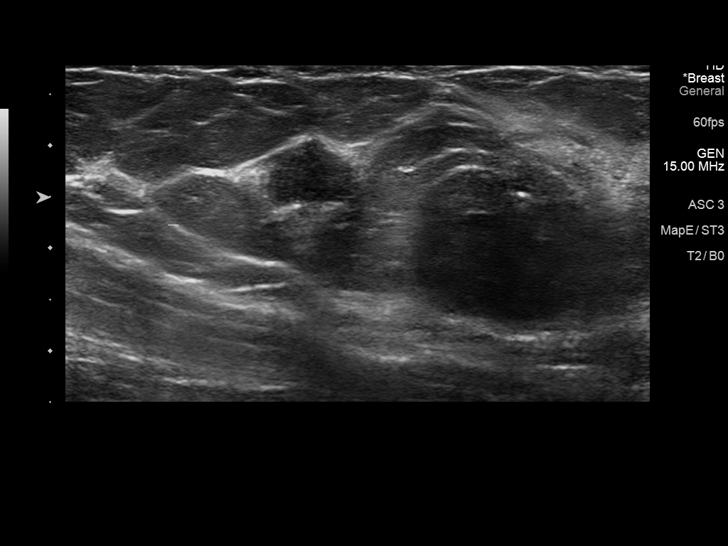
[im 6/16]
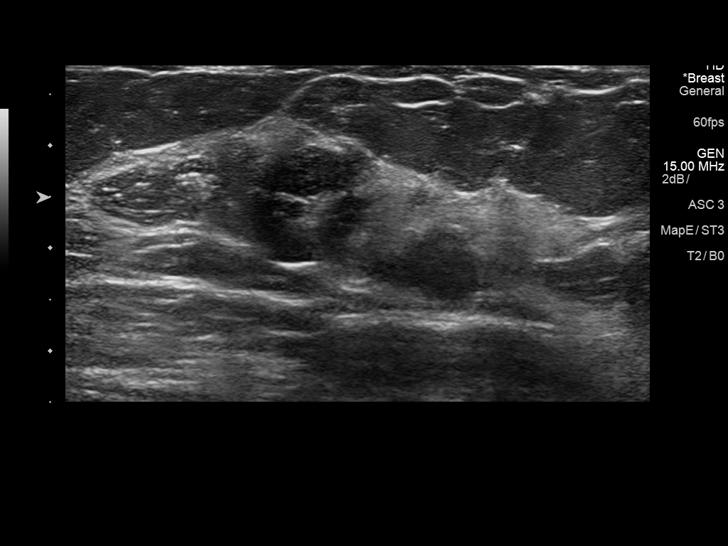
[im 7/16]
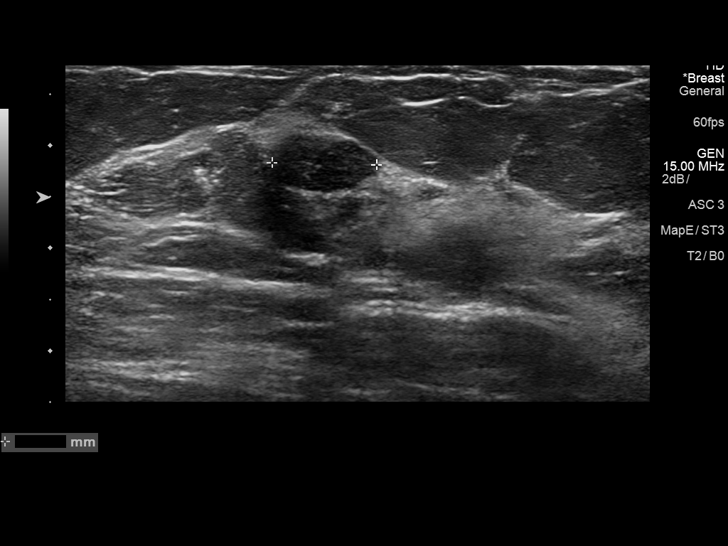
[im 9/16]
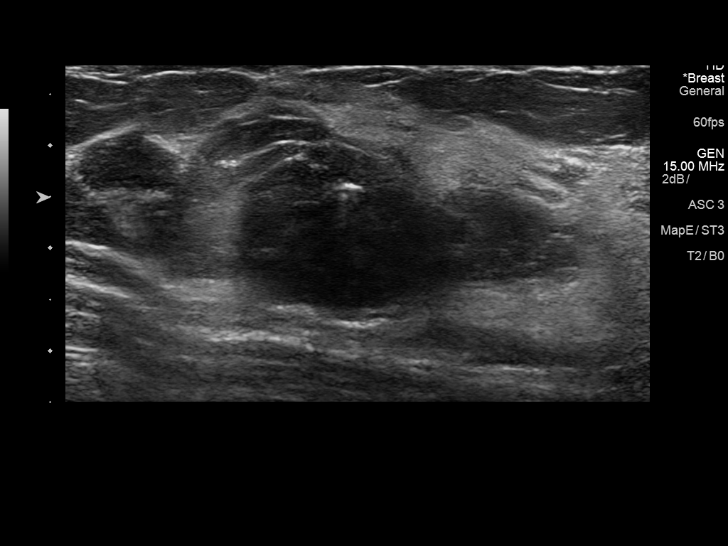
[im 10/16]
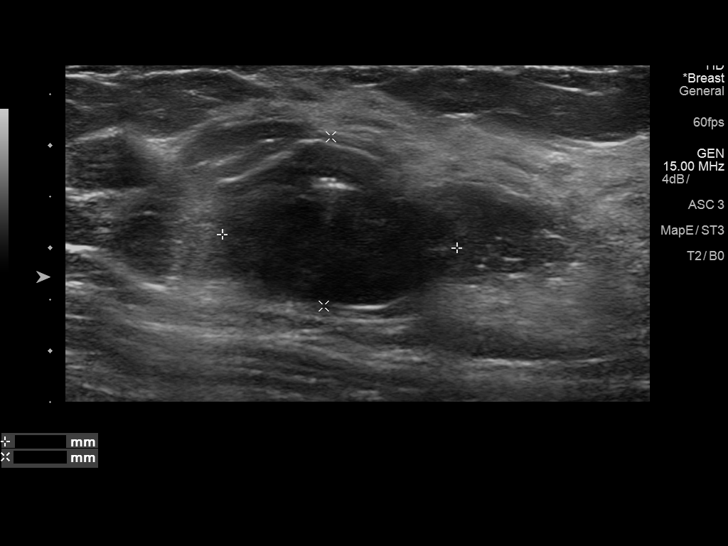
[im 11/16]
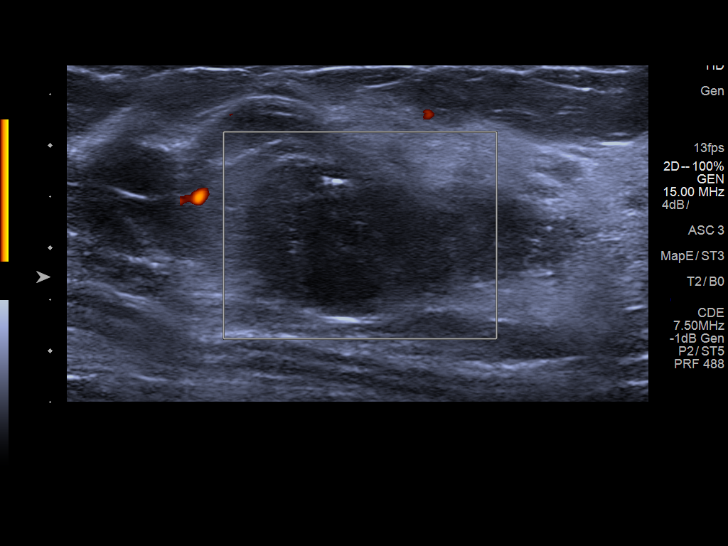
[im 12/16]
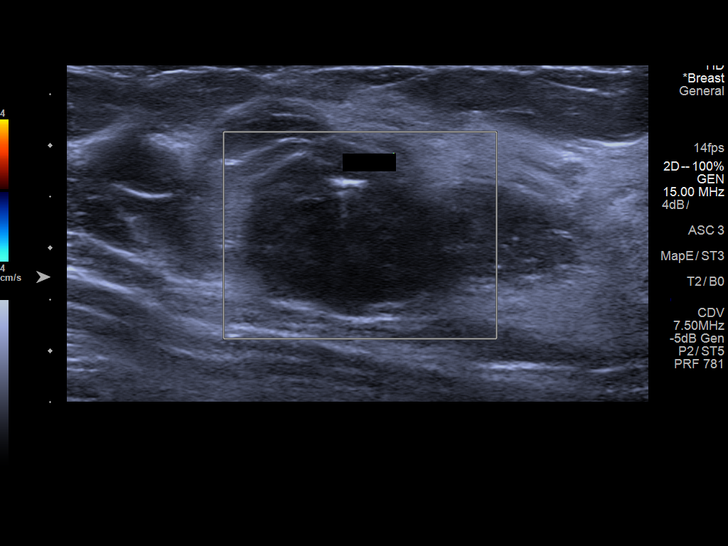
[im 13/16]
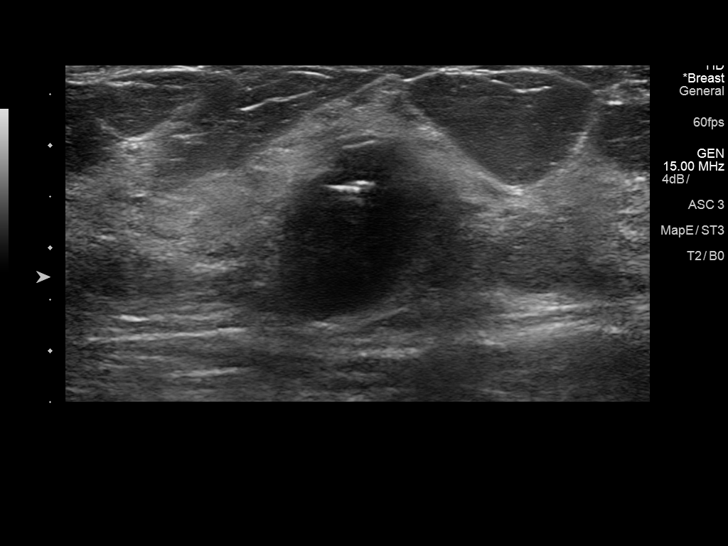
[im 15/16]
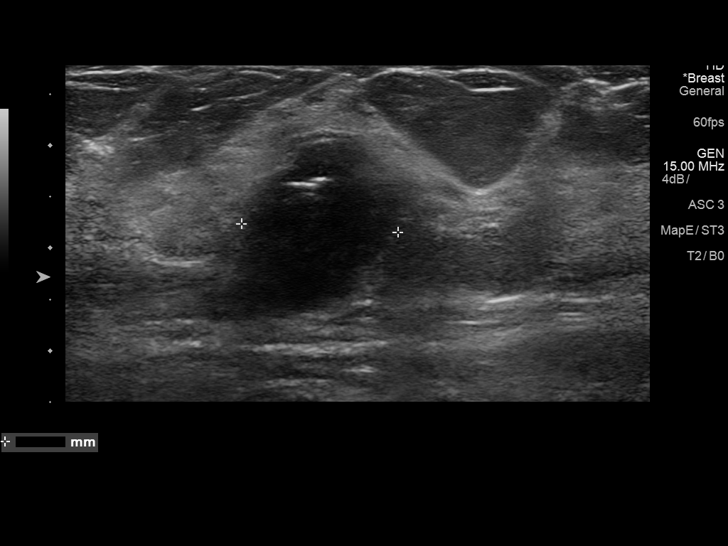
[im 16/16]
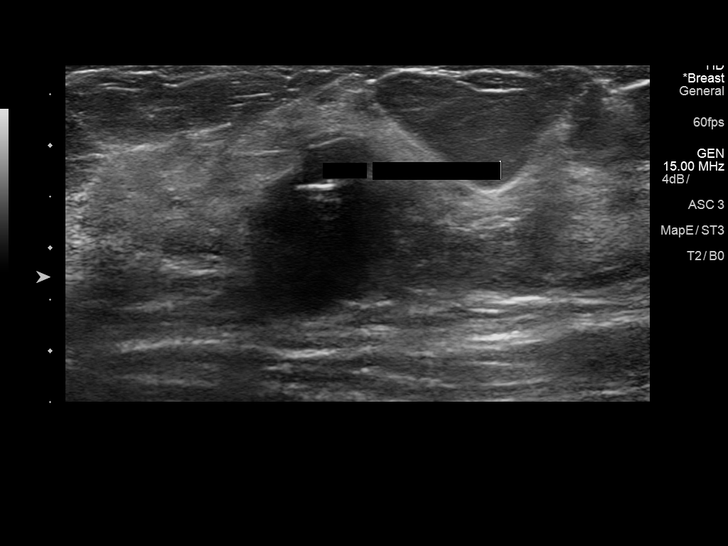

[13 of 16 positions shown; findings below may reference images not displayed]

FINDINGS: Targeted ultrasound is performed, showing the biopsy-proven
fibroadenoma in the right breast at the 11 o'clock axis, 10 cm from
the nipple, containing a biopsy clip, measuring 2.3 x 1.5 x 1.7 cm,
not significantly changed compared to the previous exams given
differences in measuring technique.

Also again noted is the smaller adjacent oval circumscribed
hypoechoic mass at the 11 o'clock axis, 10 cm from the nipple,
measuring 1 x 0.6 x 0.9 cm, also not significantly changed compared
to the previous exams, presumed additional benign fibroadenoma.
IMPRESSION: Two adjacent masses, including a biopsy-proven fibroadenoma, located
within the right breast at the 11 o'clock axis, 10 cm from the
nipple, both not significantly changed for nearly 2 years indicating
benignity for both. No additional follow-up imaging is necessary for
these benign findings.

RECOMMENDATION:
Screening mammogram at age 39 unless there are persistent or
intervening clinical concerns. (Code:5L-[id number])

The patient was instructed to return sooner if the area that she
feels becomes larger and/or firmer to palpation, or if a new
palpable abnormality is identified in either breast.

I have discussed the findings and recommendations with the patient.
Results were also provided in writing at the conclusion of the
visit. If applicable, a reminder letter will be sent to the patient
regarding the next appointment.

BI-RADS CATEGORY  2: Benign.

## 2019-07-04 ENCOUNTER — Ambulatory Visit
Admission: EM | Admit: 2019-07-04 | Discharge: 2019-07-04 | Disposition: A | Discharge status: Home / Self Care | Payer: 59 | Attending: Emergency Medicine | Admitting: Emergency Medicine

## 2019-07-04 DIAGNOSIS — Z20822 Contact with and (suspected) exposure to covid-19: Secondary | ICD-10-CM | POA: Diagnosis not present

## 2019-07-04 NOTE — ED Triage Notes (Signed)
Pt here for covid test after positive exposure  

## 2019-07-04 NOTE — ED Provider Notes (Signed)
[facility]    CSN: [id number] Arrival date & time: 07/04/19  1533      History   Chief Complaint No chief complaint on file.   HPI Michele Phillips is a 29 y.o. female.   Michele Phillips 29 years old female presented to the urgent care with a complaint of Covid exposure.  Denies sick exposure to  flu or strep.  Denies recent travel.  Denies aggravating or alleviating symptoms.  Denies previous COVID infection.   Denies fever, chills, fatigue, nasal congestion, rhinorrhea, sore throat, cough, SOB, wheezing, chest pain, nausea, vomiting, changes in bowel or bladder habits.     The history is provided by the patient. No language interpreter was used.    Past Medical History:  Diagnosis Date  . BV (bacterial vaginosis)   . Hx of chlamydia infection   . No pertinent past medical history     Patient Active Problem List   Diagnosis Date Noted  . BV (bacterial vaginosis) 12/30/2012  . Condyloma acuminatum in female 01/31/2011    Past Surgical History:  Procedure Laterality Date  . NO PAST SURGERIES      OB History    Gravida  1   Para  1   Term  1   Preterm  0   AB  0   Living  1     SAB      TAB      Ectopic      Multiple      Live Births  1            Home Medications    Prior to Admission medications   Medication Sig Start Date End Date Taking? Authorizing Provider  etonogestrel (IMPLANON) 68 MG IMPL implant Inject 1 each into the skin once.    [provider]    Family History Family History  Problem Relation Age of Onset  . Diabetes Maternal Grandmother   . Hypertension Maternal Grandmother     Social History Social History   Tobacco Use  . Smoking status: Never Smoker  . Smokeless tobacco: Never Used  Substance Use Topics  . Alcohol use: No  . Drug use: No     Allergies   Patient has no known allergies.   Review of Systems Review of Systems  Constitutional: Negative.   HENT: Negative.    Respiratory: Negative.   Cardiovascular: Negative.   Gastrointestinal: Negative.   Neurological: Negative.      Physical Exam Triage Vital Signs ED Triage Vitals  Enc Vitals Group     BP 07/04/19 1558 (!) 152/92     Pulse Rate 07/04/19 1558 82     Resp 07/04/19 1558 16     Temp 07/04/19 1558 99.3 F (37.4 C)     Temp Source 07/04/19 1558 Oral     SpO2 07/04/19 1558 97 %     Weight --      Height --      Head Circumference --      Peak Flow --      Pain Score 07/04/19 1559 0     Pain Loc --      Pain Edu? --      Excl. in [place]? --    No data found.  Updated Vital Signs BP (!) 152/92 (BP Location: Right Arm)   Pulse 82   Temp 99.3 F (37.4 C) (Oral)   Resp 16   LMP 06/20/2019   SpO2 97%   Visual Acuity  Right Eye Distance:   Left Eye Distance:   Bilateral Distance:    Right Eye Near:   Left Eye Near:    Bilateral Near:     Physical Exam Vitals and nursing note reviewed.  Constitutional:      General: She is not in acute distress.    Appearance: Normal appearance. She is normal weight. She is not ill-appearing or toxic-appearing.  HENT:     Head: Normocephalic.     Right Ear: Tympanic membrane, ear canal and external ear normal. There is no impacted cerumen.     Left Ear: Tympanic membrane, ear canal and external ear normal. There is no impacted cerumen.     Nose: Nose normal. No congestion.     Mouth/Throat:     Mouth: Mucous membranes are moist.     Pharynx: No oropharyngeal exudate or posterior oropharyngeal erythema.  Cardiovascular:     Rate and Rhythm: Normal rate and regular rhythm.     Pulses: Normal pulses.     Heart sounds: Normal heart sounds. No murmur.  Pulmonary:     Effort: Pulmonary effort is normal. No respiratory distress.     Breath sounds: Normal breath sounds. No wheezing or rhonchi.  Chest:     Chest wall: No tenderness.  Abdominal:     General: Abdomen is flat. Bowel sounds are normal. There is no distension.     Palpations:  There is no mass.  Skin:    Capillary Refill: Capillary refill takes less than 2 seconds.  Neurological:     Mental Status: She is alert and oriented to person, place, and time.      UC Treatments / Results  Labs (all labs ordered are listed, but only abnormal results are displayed) Labs Reviewed  NOVEL CORONAVIRUS, NAA    EKG   Radiology No results found.  Procedures Procedures (including critical care time)  Medications Ordered in UC Medications - No data to display  Initial Impression / Assessment and Plan / UC Course  I have reviewed the triage vital signs and the nursing notes.  Pertinent labs & imaging results that were available during my care of the patient were reviewed by me and considered in my medical decision making (see chart for details).   COVID-19 test was ordered.  Patient is stable for discharge and in no acute distress.  Advised patient to quarantine until COVID-19 test result become available.  To go to ED for worsening of symptoms.  Patient verbalized understanding plan of care.   Final Clinical Impressions(s) / UC Diagnoses   Final diagnoses:  Exposure to COVID-19 virus     Discharge Instructions     COVID testing ordered.  It will take between 2-7 days for test results.  Someone will contact you regarding abnormal results.    In the meantime: You should remain isolated in your home for 10 days from symptom onset AND greater than 72 hours after symptoms resolution (absence of fever without the use of fever-reducing medication and improvement in respiratory symptoms), whichever is longer Get plenty of rest and push fluids Use medications daily for symptom relief Use OTC medications like ibuprofen or tylenol as needed fever or pain Call or go to the ED if you have any new or worsening symptoms such as fever, worsening cough, shortness of breath, chest tightness, chest pain, turning blue, changes in mental status, etc...     ED Prescriptions     None     PDMP not reviewed this  encounter.   Emerson Monte, [place] 07/04/19 1607

## 2019-07-04 NOTE — Discharge Instructions (Signed)

## 2019-07-05 LAB — NOVEL CORONAVIRUS, NAA: SARS-CoV-2, NAA: NOT DETECTED

## 2019-10-05 ENCOUNTER — Other Ambulatory Visit: Payer: Self-pay

## 2019-10-05 ENCOUNTER — Ambulatory Visit
Admission: EM | Admit: 2019-10-05 | Discharge: 2019-10-05 | Disposition: A | Discharge status: Home / Self Care | Payer: 59

## 2019-10-05 DIAGNOSIS — Z20822 Contact with and (suspected) exposure to covid-19: Secondary | ICD-10-CM

## 2019-10-05 MED ORDER — ACETAMINOPHEN 325 MG PO TABS: 650.0000 mg | ORAL_TABLET | Freq: Four times a day (QID) | ORAL | 0 refills | Status: DC | PRN

## 2019-10-05 MED ORDER — FLUTICASONE PROPIONATE 50 MCG/ACT NA SUSP: 1.0000 | Freq: Every day | NASAL | 0 refills | Status: DC

## 2019-10-05 NOTE — ED Triage Notes (Addendum)
Pt reports HA, fever, chills, body aches worsening x 2 days.  Nyquil provided no relief.  Pt denies SOB, sore throat, cough.  No known exposure.

## 2019-10-05 NOTE — ED Provider Notes (Signed)
[facility]    CSN: [id number] Arrival date & time: 10/05/19  1222      History   Chief Complaint Chief Complaint  Patient presents with  . Fever    HPI Michele Phillips is a 29 y.o. female.   Who presented to the urgent care for complaint of chills and fever, headache and body ache for the past 2 days.  Has used NyQuil without relief.  Denies sick exposure to COVID, flu or strep.  Denies recent travel.  Denies aggravating or alleviating symptoms.  Denies previous COVID infection.   Denies  fatigue, nasal congestion, rhinorrhea, sore throat, cough, SOB, wheezing, chest pain, nausea, vomiting, changes in bowel or bladder habits.    The history is provided by the patient. No language interpreter was used.  Fever Associated symptoms: chills, headaches and myalgias     Past Medical History:  Diagnosis Date  . BV (bacterial vaginosis)   . Hx of chlamydia infection   . No pertinent past medical history     Patient Active Problem List   Diagnosis Date Noted  . BV (bacterial vaginosis) 12/30/2012  . Condyloma acuminatum in female 01/31/2011    Past Surgical History:  Procedure Laterality Date  . NO PAST SURGERIES      OB History    Gravida  1   Para  1   Term  1   Preterm  0   AB  0   Living  1     SAB      TAB      Ectopic      Multiple      Live Births  1            Home Medications    Prior to Admission medications   Medication Sig Start Date End Date Taking? Authorizing Provider  levonorgestrel-ethinyl estradiol (SEASONALE) 0.15-0.03 MG tablet Take 1 tablet by mouth daily.   Yes [provider]  etonogestrel (IMPLANON) 68 MG IMPL implant Inject 1 each into the skin once.    [provider]    Family History Family History  Problem Relation Age of Onset  . Diabetes Maternal Grandmother   . Hypertension Maternal Grandmother     Social History Social History   Tobacco Use  . Smoking status: Never Smoker   . Smokeless tobacco: Never Used  Substance Use Topics  . Alcohol use: No  . Drug use: No     Allergies   Patient has no known allergies.   Review of Systems Review of Systems  Constitutional: Positive for chills and fever.  HENT: Negative.   Respiratory: Negative.   Cardiovascular: Negative.   Gastrointestinal: Negative.   Musculoskeletal: Positive for myalgias.  Neurological: Positive for headaches.  All other systems reviewed and are negative.    Physical Exam Triage Vital Signs ED Triage Vitals [10/05/19 1237]  Enc Vitals Group     BP      Pulse      Resp      Temp      Temp src      SpO2      Weight      Height      Head Circumference      Peak Flow      Pain Score 0     Pain Loc      Pain Edu?      Excl. in [place]?    No data found.  Updated Vital Signs BP 120/78 (BP  Location: Right Arm)   Pulse (!) 111   Temp (!) 101.2 F (38.4 C) (Oral)   Resp 18   LMP 09/21/2019   SpO2 97%   Visual Acuity Right Eye Distance:   Left Eye Distance:   Bilateral Distance:    Right Eye Near:   Left Eye Near:    Bilateral Near:     Physical Exam Vitals and nursing note reviewed.  Constitutional:      General: She is not in acute distress.    Appearance: Normal appearance. She is normal weight. She is not ill-appearing or toxic-appearing.  HENT:     Head: Normocephalic.     Right Ear: Tympanic membrane, ear canal and external ear normal. There is no impacted cerumen.     Left Ear: Tympanic membrane, ear canal and external ear normal. There is no impacted cerumen.     Nose: Nose normal. No congestion.     Mouth/Throat:     Mouth: Mucous membranes are moist.     Pharynx: Oropharynx is clear. No oropharyngeal exudate or posterior oropharyngeal erythema.  Cardiovascular:     Rate and Rhythm: Regular rhythm. Tachycardia present.     Pulses: Normal pulses.     Heart sounds: Normal heart sounds. No murmur.  Pulmonary:     Effort: Pulmonary effort is normal. No  respiratory distress.     Breath sounds: Normal breath sounds. No stridor. No wheezing, rhonchi or rales.  Chest:     Chest wall: No tenderness.  Neurological:     Mental Status: She is alert and oriented to person, place, and time.      UC Treatments / Results  Labs (all labs ordered are listed, but only abnormal results are displayed) Labs Reviewed  NOVEL CORONAVIRUS, NAA  POC SARS CORONAVIRUS 2 AG -  ED    EKG   Radiology No results found.  Procedures Procedures (including critical care time)  Medications Ordered in UC Medications - No data to display  Initial Impression / Assessment and Plan / UC Course  I have reviewed the triage vital signs and the nursing notes.  Pertinent labs & imaging results that were available during my care of the patient were reviewed by me and considered in my medical decision making (see chart for details).    Patient is stable at discharge.  POCT COVID-19 test was negative.  PCR COVID-19 test was ordered.  Was advised to quarantine.  Work note was given.  To return or go to ED for worsening of symptoms.  Final Clinical Impressions(s) / UC Diagnoses   Final diagnoses:  Suspected COVID-19 virus infection     Discharge Instructions     COVID testing ordered.  It will take between 2-7 days for test results.  Someone will contact you regarding abnormal results.    In the meantime: You should remain isolated in your home for 10 days from symptom onset AND greater than 24 hours after symptoms resolution (absence of fever without the use of fever-reducing medication and improvement in respiratory symptoms), whichever is longer Get plenty of rest and push fluids Tylenol was prescribed for body aches, fever and headache Flonase prescribed for nasal congestion and runny nose Use medications daily for symptom relief Use OTC medications like ibuprofen or tylenol as needed fever or pain Call or go to the ED if you have any new or worsening  symptoms such as fever, worsening cough, shortness of breath, chest tightness, chest pain, turning blue, changes in mental status,  etc...     ED Prescriptions    None     PDMP not reviewed this encounter.   Emerson Monte, FNP 10/05/19 1311

## 2019-10-05 NOTE — Discharge Instructions (Addendum)
COVID testing ordered.  It will take between 2-7 days for test results.  Someone will contact you regarding abnormal results.    In the meantime: You should remain isolated in your home for 10 days from symptom onset AND greater than 24 hours after symptoms resolution (absence of fever without the use of fever-reducing medication and improvement in respiratory symptoms), whichever is longer Get plenty of rest and push fluids Tylenol was prescribed for body aches, fever and headache Flonase prescribed for nasal congestion and runny nose Use medications daily for symptom relief Use OTC medications like ibuprofen or tylenol as needed fever or pain Call or go to the ED if you have any new or worsening symptoms such as fever, worsening cough, shortness of breath, chest tightness, chest pain, turning blue, changes in mental status, etc..Marland Kitchen

## 2019-10-06 LAB — SARS-COV-2, NAA 2 DAY TAT

## 2019-10-06 LAB — NOVEL CORONAVIRUS, NAA: SARS-CoV-2, NAA: NOT DETECTED

## 2019-10-08 ENCOUNTER — Encounter (HOSPITAL_COMMUNITY): Payer: Self-pay | Admitting: Emergency Medicine

## 2019-10-08 ENCOUNTER — Other Ambulatory Visit: Payer: Self-pay

## 2019-10-08 ENCOUNTER — Emergency Department (HOSPITAL_COMMUNITY): Payer: 59

## 2019-10-08 ENCOUNTER — Emergency Department (HOSPITAL_COMMUNITY)
Admission: EM | Admit: 2019-10-08 | Discharge: 2019-10-08 | Disposition: A | Discharge status: Home / Self Care | Payer: 59 | Attending: Emergency Medicine | Admitting: Emergency Medicine

## 2019-10-08 DIAGNOSIS — Z79899 Other long term (current) drug therapy: Secondary | ICD-10-CM | POA: Insufficient documentation

## 2019-10-08 DIAGNOSIS — R519 Headache, unspecified: Secondary | ICD-10-CM | POA: Diagnosis not present

## 2019-10-08 DIAGNOSIS — Z20822 Contact with and (suspected) exposure to covid-19: Secondary | ICD-10-CM | POA: Insufficient documentation

## 2019-10-08 DIAGNOSIS — R509 Fever, unspecified: Secondary | ICD-10-CM | POA: Diagnosis present

## 2019-10-08 DIAGNOSIS — B349 Viral infection, unspecified: Secondary | ICD-10-CM | POA: Insufficient documentation

## 2019-10-08 LAB — RESPIRATORY PANEL BY RT PCR (FLU A&B, COVID)
Influenza A by PCR: NEGATIVE
Influenza B by PCR: NEGATIVE
SARS Coronavirus 2 by RT PCR: NEGATIVE

## 2019-10-08 MED ORDER — ACETAMINOPHEN 325 MG PO TABS
650.0000 mg | ORAL_TABLET | Freq: Once | ORAL | Status: AC
  Administered 2019-10-08: 650 mg via ORAL
  Filled 2019-10-08: qty 2

## 2019-10-08 MED ORDER — IBUPROFEN 800 MG PO TABS
800.0000 mg | ORAL_TABLET | Freq: Once | ORAL | Status: AC
  Administered 2019-10-08: 05:00:00 800 mg via ORAL
  Filled 2019-10-08: qty 1

## 2019-10-08 NOTE — Discharge Instructions (Signed)
You can alternate Tylenol or ibuprofen as needed for aches and fever.  Keep her self quarantine at home for total of 14 days.  You declined lumbar puncture today but we do not suspect bacterial meningitis.  Follow-up with your doctor.  Return to the ED with difficulty breathing, difficulty swallowing, not able to eat or drink, behavior change, confusion, or other concerns.

## 2019-10-08 NOTE — ED Provider Notes (Signed)
[facility] EMERGENCY DEPARTMENT Provider Note   CSN: [id number] Arrival date & time: 10/08/19  0346     History Chief Complaint  Patient presents with  . Fever, body aches, chills    Michele Phillips is a 29 y.o. female.  Patient with a 6-day history of fevers, body aches, chills, neck pain and headache.  States she been taking Tylenol at home for her fever every 6 hours but the pain comes back and her fever comes back with the medication wears off.  She was seen in urgent care on April 14 has a negative Covid test.  States she feels chills and aches all over worse in her neck.  She has pain with turning her head to the left.  She had one episode of vomiting today but otherwise no vomiting or diarrhea.  Denies any chest pain or shortness of breath.  Denies any sore throat, runny nose.  No cough.  No pain with urination or blood in urine.  No known coronavirus exposures.  No recent travel or sick contacts.  Not receive a flu shot. Headache is gradual in onset.  No photophobia or phonophobia.  1 episode of vomiting but no other vomiting.  No focal weakness, numbness or tingling.  Comes in this morning because she is having persistent chills and fever despite receiving Tylenol.  The history is provided by the patient.       Past Medical History:  Diagnosis Date  . BV (bacterial vaginosis)   . Hx of chlamydia infection   . No pertinent past medical history     Patient Active Problem List   Diagnosis Date Noted  . BV (bacterial vaginosis) 12/30/2012  . Condyloma acuminatum in female 01/31/2011    Past Surgical History:  Procedure Laterality Date  . NO PAST SURGERIES       OB History    Gravida  1   Para  1   Term  1   Preterm  0   AB  0   Living  1     SAB      TAB      Ectopic      Multiple      Live Births  1           Family History  Problem Relation Age of Onset  . Diabetes Maternal Grandmother   . Hypertension Maternal Grandmother      Social History   Tobacco Use  . Smoking status: Never Smoker  . Smokeless tobacco: Never Used  Substance Use Topics  . Alcohol use: No  . Drug use: No    Home Medications Prior to Admission medications   Medication Sig Start Date End Date Taking? Authorizing Provider  acetaminophen (TYLENOL) 325 MG tablet Take 2 tablets (650 mg total) by mouth every 6 (six) hours as needed. 10/05/19   Avegno, Darrelyn Hillock, FNP  etonogestrel (IMPLANON) 68 MG IMPL implant Inject 1 each into the skin once.    [provider]  fluticasone (FLONASE) 50 MCG/ACT nasal spray Place 1 spray into both nostrils daily for 14 days. 10/05/19 10/19/19  Avegno, Darrelyn Hillock, FNP  levonorgestrel-ethinyl estradiol (SEASONALE) 0.15-0.03 MG tablet Take 1 tablet by mouth daily.    [provider]    Allergies    Patient has no known allergies.  Review of Systems   Review of Systems  Constitutional: Positive for activity change, appetite change, chills, fatigue and fever.  HENT: Negative for congestion, rhinorrhea, sore throat and trouble  swallowing.   Respiratory: Negative for cough and chest tightness.   Cardiovascular: Negative for chest pain and leg swelling.  Gastrointestinal: Positive for vomiting.  Genitourinary: Negative for dysuria and hematuria.  Musculoskeletal: Positive for arthralgias, myalgias and neck pain.  Skin: Negative for rash.  Neurological: Positive for weakness and headaches.    all other systems are negative except as noted in the HPI and PMH.   Physical Exam Updated Vital Signs BP 123/81   Pulse (!) 110   Temp (!) 100.4 F (38 C) (Oral)   Resp 18   Ht 5\' 2"  (1.575 m)   Wt 74.4 kg   LMP 09/21/2019   SpO2 97%   BMI 30.00 kg/m   Physical Exam Vitals and nursing note reviewed.  Constitutional:      General: She is not in acute distress.    Appearance: She is well-developed.  HENT:     Head: Normocephalic and atraumatic.     Right Ear: Tympanic membrane normal.      Left Ear: Tympanic membrane normal.     Nose: Nose normal. No rhinorrhea.     Mouth/Throat:     Mouth: Mucous membranes are moist.     Pharynx: No oropharyngeal exudate or posterior oropharyngeal erythema.     Comments: No exudates or asymmetry.  Uvula midline Eyes:     Conjunctiva/sclera: Conjunctivae normal.     Pupils: Pupils are equal, round, and reactive to light.  Neck:     Comments: No meningismus. Full range of motion of neck without pain.  Able to touch chin to chest. Cardiovascular:     Rate and Rhythm: Normal rate and regular rhythm.     Heart sounds: Normal heart sounds. No murmur.  Pulmonary:     Effort: Pulmonary effort is normal. No respiratory distress.     Breath sounds: Normal breath sounds.  Abdominal:     Palpations: Abdomen is soft.     Tenderness: There is no abdominal tenderness. There is no guarding or rebound.  Musculoskeletal:        General: No tenderness. Normal range of motion.     Cervical back: Normal range of motion and neck supple.  Skin:    General: Skin is warm.     Capillary Refill: Capillary refill takes less than 2 seconds.  Neurological:     General: No focal deficit present.     Mental Status: She is alert and oriented to person, place, and time. Mental status is at baseline.     Cranial Nerves: No cranial nerve deficit.     Motor: No abnormal muscle tone.     Coordination: Coordination normal.     Comments:  5/5 strength throughout. CN 2-12 intact.Equal grip strength.   Psychiatric:        Behavior: Behavior normal.     ED Results / Procedures / Treatments   Labs (all labs ordered are listed, but only abnormal results are displayed) Labs Reviewed  RESPIRATORY PANEL BY RT PCR (FLU A&B, COVID)    EKG None  Radiology DG Chest Portable 1 View  Result Date: 10/08/2019 CLINICAL DATA:  Cough and fever EXAM: PORTABLE CHEST 1 VIEW COMPARISON:  01/25/2007 FINDINGS: The heart size and mediastinal contours are within normal limits.  Both lungs are clear. The visualized skeletal structures are unremarkable. IMPRESSION: No active disease. Electronically Signed   By: Ulyses Jarred M.D.   On: 10/08/2019 05:14    Procedures Procedures (including critical care time)  Medications Ordered in ED  Medications  ibuprofen (ADVIL) tablet 800 mg (has no administration in time range)  acetaminophen (TYLENOL) tablet 650 mg (has no administration in time range)    ED Course  I have reviewed the triage vital signs and the nursing notes.  Pertinent labs & imaging results that were available during my care of the patient were reviewed by me and considered in my medical decision making (see chart for details).    MDM Rules/Calculators/A&P                      6 days of body aches, fever, head pain, neck pain and chills.  Recent negative Covid test on April 14. Patient appears well nontoxic.  No distress, no meningismus.  Rapid Covid test is negative and influenza swab is negative.  Chest x-ray is negative.  Patient appears well.  She appears nontoxic.  Discussed that suspicion for bacterial meningitis is very low since she appears very well and has no meningismus and has been ill for 6 days.  However viral meningitis is still a possibility.  Discussed that viral meningitis is treated the same as any other virus with supportive care, antipyretics and p.o. hydration. Patient agrees that she does not want to proceed with lumbar puncture at this time if it will not change clinical course.  Discussed lumbar puncture is only way to rule out meningitis.  Risks and benefits of lumbar puncture discussed.  She declines at this time and does not want to pursue lumbar puncture.   Discussed p.o. hydration at home, alternating Tylenol and ibuprofen as needed for aches and fevers every 3 hours. Discussed that she can return at anytime if she changes her mind about lumbar puncture. Discussed that influenza and/or coronavirus are still a possibility  despite her negative test and recommend continued quarantine precautions at home. Return precautions discussed.   Chalea Holthaus was evaluated in Emergency Department on 10/08/2019 for the symptoms described in the history of present illness. She was evaluated in the context of the global COVID-19 pandemic, which necessitated consideration that the patient might be at risk for infection with the SARS-CoV-2 virus that causes COVID-19. Institutional protocols and algorithms that pertain to the evaluation of patients at risk for COVID-19 are in a state of rapid change based on information released by regulatory bodies including the CDC and federal and state organizations. These policies and algorithms were followed during the patient's care in the ED.  Final Clinical Impression(s) / ED Diagnoses Final diagnoses:  Viral syndrome  Suspected COVID-19 virus infection    Rx / DC Orders ED Discharge Orders    None       Kaymen Adrian, Annie Main, MD 10/08/19 [phone]

## 2019-10-08 NOTE — ED Triage Notes (Signed)
Pt states she has been having fevers, body aches, and chills since Sunday. Was seen at urgent care on Wednesday and they performed a rapid Covid which was negative as well as a send out which came back negative as well but pt states she is still no better.

## 2021-01-03 IMAGING — DX DG CHEST 1V PORT
1 series · 1 of 1 positions shown · non-contrast
Comparison: 01/25/2007

CLINICAL DATA: Cough and fever

EXAM:
PORTABLE CHEST 1 VIEW

[chest ap]
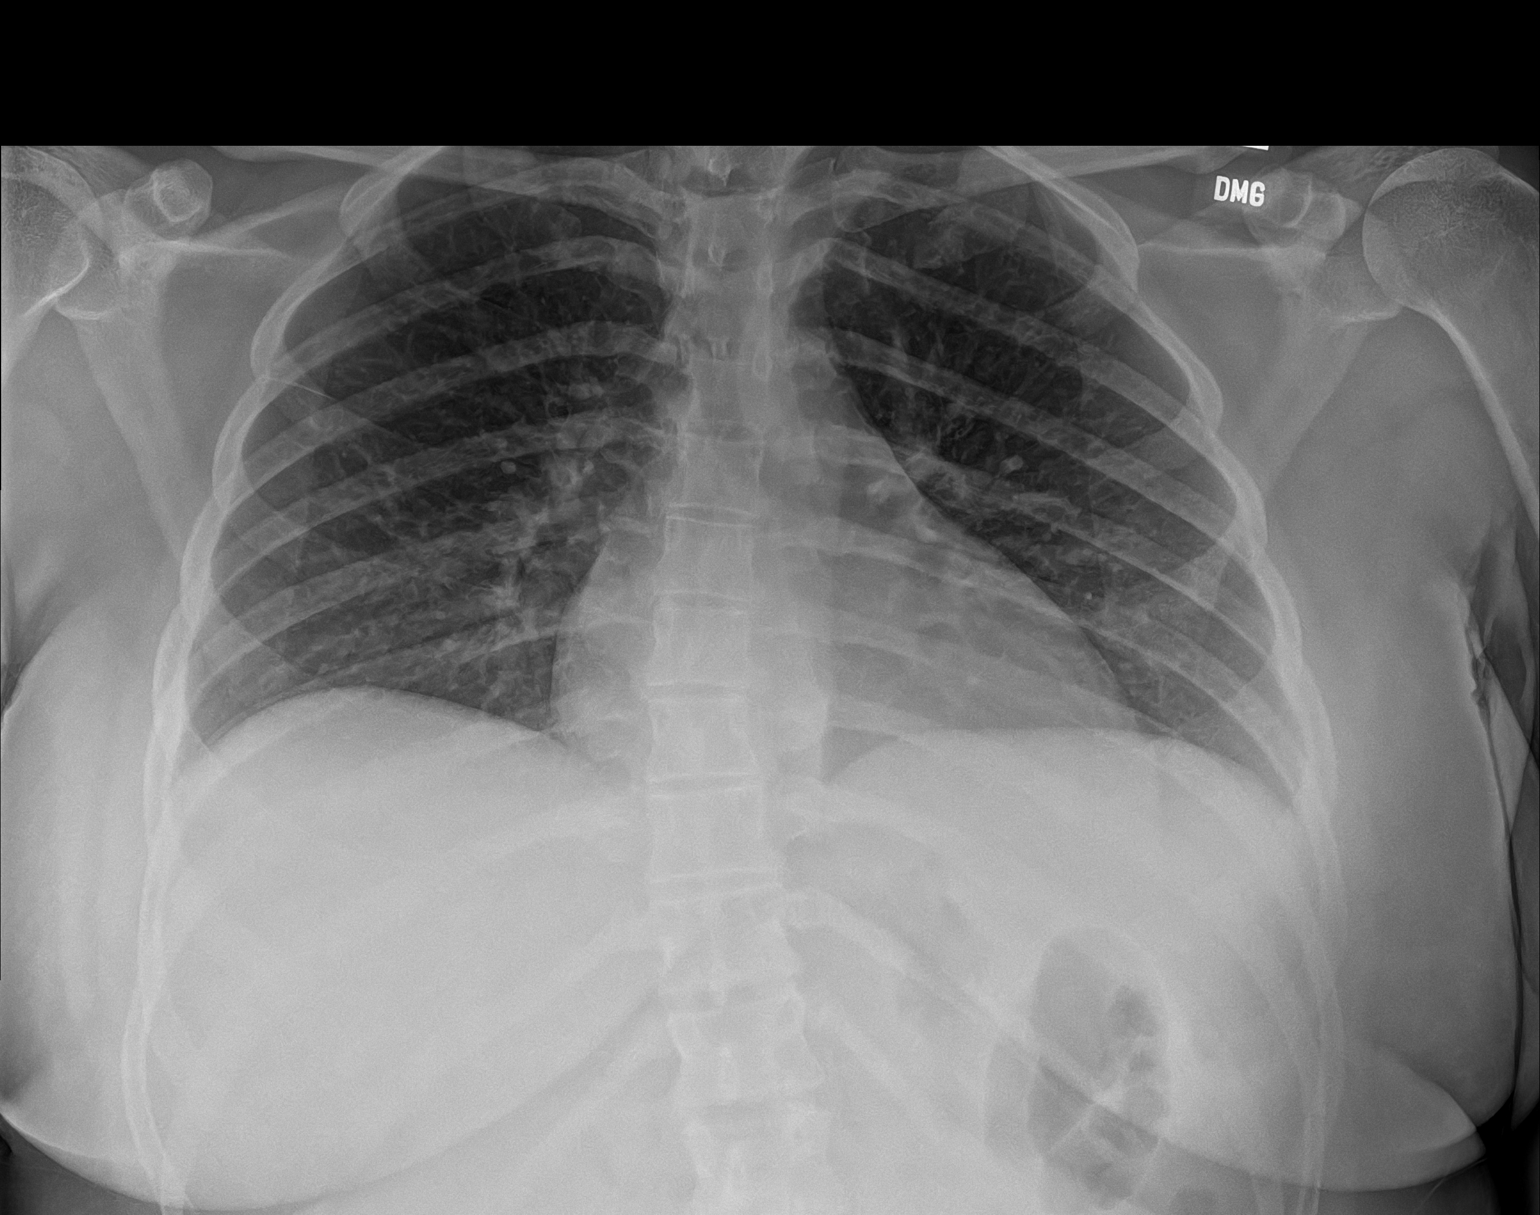

[1 of 1 positions shown; findings below may reference images not displayed]

FINDINGS: The heart size and mediastinal contours are within normal limits.
Both lungs are clear. The visualized skeletal structures are
unremarkable.
IMPRESSION: No active disease.

## 2021-08-21 ENCOUNTER — Ambulatory Visit
Admission: EM | Admit: 2021-08-21 | Discharge: 2021-08-21 | Disposition: A | Discharge status: Home / Self Care | Payer: 59 | Attending: Family Medicine | Admitting: Family Medicine

## 2021-08-21 ENCOUNTER — Other Ambulatory Visit: Payer: Self-pay

## 2021-08-21 DIAGNOSIS — Z20828 Contact with and (suspected) exposure to other viral communicable diseases: Secondary | ICD-10-CM | POA: Insufficient documentation

## 2021-08-21 DIAGNOSIS — R509 Fever, unspecified: Secondary | ICD-10-CM | POA: Diagnosis present

## 2021-08-21 DIAGNOSIS — J039 Acute tonsillitis, unspecified: Secondary | ICD-10-CM | POA: Diagnosis present

## 2021-08-21 LAB — POCT RAPID STREP A (OFFICE): Rapid Strep A Screen: NEGATIVE

## 2021-08-21 MED ORDER — AMOXICILLIN 875 MG PO TABS: 875.0000 mg | ORAL_TABLET | Freq: Two times a day (BID) | ORAL | 0 refills | Status: DC

## 2021-08-21 MED ORDER — LIDOCAINE VISCOUS HCL 2 % MT SOLN: 10.0000 mL | OROMUCOSAL | 0 refills | Status: DC | PRN

## 2021-08-21 NOTE — ED Provider Notes (Signed)
?[place] ? ? ? ?CSN: [id number] ?Arrival date & time: 08/21/21  0820 ? ? ?  ? ?History   ?Chief Complaint ?Chief Complaint  ?Patient presents with  ? Cough  ?  Sore throat and cough  ? ? ?HPI ?Michele Phillips is a 31 y.o. female.  ? ?Presenting today with 2-day history of sore, swollen throat, low-grade fever, chills, headache, malaise.  Denies significant cough, chest pain, shortness of breath, abdominal pain, nausea vomiting or diarrhea.  So far trying Tylenol Cold and sinus with very minimal temporary relief of symptoms.  Son now sick with somewhat similar symptoms.  No known sick contacts recently.  No known pertinent chronic medical problems. ? ? ?Past Medical History:  ?Diagnosis Date  ? BV (bacterial vaginosis)   ? Hx of chlamydia infection   ? No pertinent past medical history   ? ? ?Patient Active Problem List  ? Diagnosis Date Noted  ? BV (bacterial vaginosis) 12/30/2012  ? Condyloma acuminatum in female 01/31/2011  ? ? ?Past Surgical History:  ?Procedure Laterality Date  ? NO PAST SURGERIES    ? ? ?OB History   ? ? Gravida  ?1  ? Para  ?1  ? Term  ?1  ? Preterm  ?0  ? AB  ?0  ? Living  ?1  ?  ? ? SAB  ?   ? IAB  ?   ? Ectopic  ?   ? Multiple  ?   ? Live Births  ?1  ?   ?  ?  ? ? ? ?Home Medications   ? ?Prior to Admission medications   ?Medication Sig Start Date End Date Taking? Authorizing Provider  ?amoxicillin (AMOXIL) 875 MG tablet Take 1 tablet (875 mg total) by mouth 2 (two) times daily. 08/21/21  Yes Volney American, PA-C  ?lidocaine (XYLOCAINE) 2 % solution Use as directed 10 mLs in the mouth or throat every 3 (three) hours as needed for mouth pain. 08/21/21  Yes Volney American, PA-C  ?acetaminophen (TYLENOL) 325 MG tablet Take 2 tablets (650 mg total) by mouth every 6 (six) hours as needed. 10/05/19   Emerson Monte, FNP  ?etonogestrel (IMPLANON) 68 MG IMPL implant Inject 1 each into the skin once.    [provider]  ?fluticasone (FLONASE) 50 MCG/ACT nasal  spray Place 1 spray into both nostrils daily for 14 days. 10/05/19 10/19/19  Emerson Monte, FNP  ?levonorgestrel-ethinyl estradiol (SEASONALE) 0.15-0.03 MG tablet Take 1 tablet by mouth daily.    [provider]  ? ? ?Family History ?Family History  ?Problem Relation Age of Onset  ? Diabetes Maternal Grandmother   ? Hypertension Maternal Grandmother   ? ? ?Social History ?Social History  ? ?Tobacco Use  ? Smoking status: Never  ? Smokeless tobacco: Never  ?Vaping Use  ? Vaping Use: Never used  ?Substance Use Topics  ? Alcohol use: No  ? Drug use: No  ? ? ? ?Allergies   ?Patient has no known allergies. ? ? ?Review of Systems ?Review of Systems ?Per HPI ? ?Physical Exam ?Triage Vital Signs ?ED Triage Vitals  ?Enc Vitals Group  ?   BP 08/21/21 0852 116/72  ?   Pulse Rate 08/21/21 0852 (!) 111  ?   Resp 08/21/21 0852 16  ?   Temp 08/21/21 0852 99.4 ?F (37.4 ?C)  ?   Temp Source 08/21/21 0852 Oral  ?   SpO2 08/21/21 0852 97 %  ?  Weight --   ?   Height --   ?   Head Circumference --   ?   Peak Flow --   ?   Pain Score 08/21/21 0849 0  ?   Pain Loc --   ?   Pain Edu? --   ?   Excl. in [place]? --   ? ?No data found. ? ?Updated Vital Signs ?BP 116/72 (BP Location: Right Arm)   Pulse (!) 111   Temp 99.4 ?F (37.4 ?C) (Oral)   Resp 16   LMP 06/21/2021 (Approximate)   SpO2 97%  ? ?Visual Acuity ?Right Eye Distance:   ?Left Eye Distance:   ?Bilateral Distance:   ? ?Right Eye Near:   ?Left Eye Near:    ?Bilateral Near:    ? ?Physical Exam ?Vitals and nursing note reviewed.  ?Constitutional:   ?   Appearance: Normal appearance. She is not ill-appearing.  ?HENT:  ?   Head: Atraumatic.  ?   Mouth/Throat:  ?   Mouth: Mucous membranes are moist.  ?   Pharynx: Posterior oropharyngeal erythema present. No oropharyngeal exudate.  ?   Comments: Moderate bilateral tonsillar erythema, edema.  Uvula midline, oral airway patent ?Eyes:  ?   Extraocular Movements: Extraocular movements intact.  ?   Conjunctiva/sclera: Conjunctivae  normal.  ?Cardiovascular:  ?   Rate and Rhythm: Normal rate and regular rhythm.  ?   Heart sounds: Normal heart sounds.  ?Pulmonary:  ?   Effort: Pulmonary effort is normal.  ?   Breath sounds: Normal breath sounds. No wheezing or rales.  ?Musculoskeletal:     ?   General: Normal range of motion.  ?   Cervical back: Normal range of motion and neck supple.  ?Lymphadenopathy:  ?   Cervical: Cervical adenopathy present.  ?Skin: ?   General: Skin is warm and dry.  ?Neurological:  ?   Mental Status: She is alert and oriented to person, place, and time.  ?   Motor: No weakness.  ?   Gait: Gait normal.  ?Psychiatric:     ?   Mood and Affect: Mood normal.     ?   Thought Content: Thought content normal.     ?   Judgment: Judgment normal.  ? ? ? ?UC Treatments / Results  ?Labs ?(all labs ordered are listed, but only abnormal results are displayed) ?Labs Reviewed  ?COVID-19, FLU A+B NAA  ?CULTURE, GROUP A STREP [facility])  ?POCT RAPID STREP A (OFFICE)  ? ? ?EKG ? ? ?Radiology ?No results found. ? ?Procedures ?Procedures (including critical care time) ? ?Medications Ordered in UC ?Medications - No data to display ? ?Initial Impression / Assessment and Plan / UC Course  ?I have reviewed the triage vital signs and the nursing notes. ? ?Pertinent labs & imaging results that were available during my care of the patient were reviewed by me and considered in my medical decision making (see chart for details). ? ?  ? ?Tachycardic in triage, otherwise vital signs reassuring.  Rapid strep negative, throat culture COVID and flu testing pending.  Treat for possible tonsillitis with amoxicillin, viscous lidocaine while awaiting results.  Return for any acutely worsening symptoms.  Work note given. ? ? ?Final Clinical Impressions(s) / UC Diagnoses  ? ?Final diagnoses:  ?Acute tonsillitis, unspecified etiology  ?Fever, unspecified  ? ?Discharge Instructions   ?None ?  ? ?ED Prescriptions   ? ? Medication Sig Dispense Auth. Provider  ?  amoxicillin (AMOXIL)  875 MG tablet Take 1 tablet (875 mg total) by mouth 2 (two) times daily. 20 tablet Volney American, [place]  ? lidocaine (XYLOCAINE) 2 % solution Use as directed 10 mLs in the mouth or throat every 3 (three) hours as needed for mouth pain. 100 mL Volney American, [place]  ? ?  ? ?PDMP not reviewed this encounter. ?  ?Volney American, PA-C ?08/21/21 0945 ? ?

## 2021-08-21 NOTE — ED Triage Notes (Signed)
Pt states that she woke up Monday with a sore throat ? ?Pt states that progressed to chills and a headache and a fever ? ?Pt states she tried Tylenol and cold and flu and it gave her some relief ?

## 2021-08-22 LAB — COVID-19, FLU A+B NAA
Influenza A, NAA: NOT DETECTED
Influenza B, NAA: NOT DETECTED
SARS-CoV-2, NAA: DETECTED — AB

## 2021-08-24 LAB — CULTURE, GROUP A STREP (THRC)

## 2022-05-23 ENCOUNTER — Ambulatory Visit (INDEPENDENT_AMBULATORY_CARE_PROVIDER_SITE_OTHER): Payer: 59

## 2022-05-23 ENCOUNTER — Encounter: Payer: Self-pay | Admitting: Pulmonary Disease

## 2022-05-23 ENCOUNTER — Ambulatory Visit (INDEPENDENT_AMBULATORY_CARE_PROVIDER_SITE_OTHER): Payer: 59 | Admitting: Pulmonary Disease

## 2022-05-23 VITALS — BP 118/82 | HR 87 | Temp 99.0°F | Ht 62.0 in | Wt 178.6 lb

## 2022-05-23 DIAGNOSIS — R053 Chronic cough: Secondary | ICD-10-CM

## 2022-05-23 MED ORDER — BENZONATATE 200 MG PO CAPS: 200.0000 mg | ORAL_CAPSULE | Freq: Three times a day (TID) | ORAL | 2 refills | Status: DC | PRN

## 2022-05-23 NOTE — Patient Instructions (Signed)
Chest x-ray  PFT  Will see in about 6 weeks  Call with significant concerns  Prescription for benzonatate to be used as needed sent to pharmacy

## 2022-05-23 NOTE — Progress Notes (Signed)
Michele Phillips    [id number]    1990-08-27  Primary Care Physician:[facility], [place]  Referring Physician: [facility], [facility] [address] [place],  [place] [address]  Chief complaint:   Chronic cough  HPI:  Developed a cough following COVID infection in 2021  Cough is, gone  Sometimes coughs up until the point of vomiting The cough is improved some in the last couple of months Denies any underlying lung disease Never smoker No history of asthma   Works in a dental office  No pets  Cough is worse usually in the evenings  Denies any history suggesting asthma Denies history suggesting reflux Has no sinus congestion or fullness   Outpatient Encounter Medications as of 05/23/2022  Medication Sig   acetaminophen (TYLENOL) 325 MG tablet Take 2 tablets (650 mg total) by mouth every 6 (six) hours as needed.   benzonatate (TESSALON) 200 MG capsule Take 1 capsule (200 mg total) by mouth 3 (three) times daily as needed for cough.   levonorgestrel-ethinyl estradiol (SEASONALE) 0.15-0.03 MG tablet Take 1 tablet by mouth daily.   [DISCONTINUED] amoxicillin (AMOXIL) 875 MG tablet Take 1 tablet (875 mg total) by mouth 2 (two) times daily.   [DISCONTINUED] etonogestrel (IMPLANON) 68 MG IMPL implant Inject 1 each into the skin once.   [DISCONTINUED] fluticasone (FLONASE) 50 MCG/ACT nasal spray Place 1 spray into both nostrils daily for 14 days.   [DISCONTINUED] lidocaine (XYLOCAINE) 2 % solution Use as directed 10 mLs in the mouth or throat every 3 (three) hours as needed for mouth pain.   No facility-administered encounter medications on file as of 05/23/2022.    Allergies as of 05/23/2022   (No Known Allergies)    Past Medical History:  Diagnosis Date   BV (bacterial vaginosis)    Hx of chlamydia infection    No pertinent past medical history     Past Surgical History:  Procedure Laterality Date   NO PAST SURGERIES      Family  History  Problem Relation Age of Onset   Diabetes Maternal Grandmother    Hypertension Maternal Grandmother     Social History   Socioeconomic History   Marital status: Single    Spouse name: Not on file   Number of children: Not on file   Years of education: Not on file   Highest education level: Not on file  Occupational History   Not on file  Tobacco Use   Smoking status: Never    Passive exposure: Never   Smokeless tobacco: Never  Vaping Use   Vaping Use: Never used  Substance and Sexual Activity   Alcohol use: No   Drug use: No   Sexual activity: Yes    Partners: Male    Birth control/protection: Pill  Other Topics Concern   Not on file  Social History Narrative   Not on file   Social Determinants of Health   Financial Resource Strain: Not on file  Food Insecurity: Not on file  Transportation Needs: Not on file  Physical Activity: Not on file  Stress: Not on file  Social Connections: Not on file  Intimate Partner Violence: Not on file    Review of Systems  Respiratory:  Positive for cough.     Vitals:   05/23/22 1337  BP: 118/82  Pulse: 87  Temp: 99 F (37.2 C)  SpO2: 100%     Physical Exam Constitutional:  Appearance: She is obese.  HENT:     Head: Normocephalic.     Mouth/Throat:     Mouth: Mucous membranes are moist.  Eyes:     Pupils: Pupils are equal, round, and reactive to light.  Cardiovascular:     Rate and Rhythm: Normal rate and regular rhythm.     Heart sounds: No murmur heard.    No friction rub.  Pulmonary:     Effort: No respiratory distress.     Breath sounds: No stridor. No wheezing or rhonchi.  Musculoskeletal:     Cervical back: No rigidity or tenderness.  Neurological:     Mental Status: She is alert.  Psychiatric:        Mood and Affect: Mood normal.    Data Reviewed: No previous radiological testing Last chest x-ray was in 2021 which was within normal limits reviewed by myself  Assessment:  Chronic  cough  Post COVID syndrome  Denies history of asthma, no reflux, no sinus congestion, no allergies  Plan/Recommendations: Schedule for pulmonary function test  Schedule for chest x-ray  Tentative follow-up in 6 weeks  Encouraged to call with significant concerns  Prescription for benzonatate to be used as needed   Sherrilyn Rist MD [facility] 05/23/2022, 2:06 PM  CC: [facility] [place] [facility]

## 2022-08-22 ENCOUNTER — Ambulatory Visit: Payer: 59 | Admitting: Pulmonary Disease

## 2022-10-17 ENCOUNTER — Ambulatory Visit
Admission: EM | Admit: 2022-10-17 | Discharge: 2022-10-17 | Disposition: A | Discharge status: Home / Self Care | Payer: Managed Care, Other (non HMO) | Attending: Nurse Practitioner | Admitting: Nurse Practitioner

## 2022-10-17 ENCOUNTER — Ambulatory Visit: Payer: 59 | Admitting: Nurse Practitioner

## 2022-10-17 DIAGNOSIS — B349 Viral infection, unspecified: Secondary | ICD-10-CM

## 2022-10-17 DIAGNOSIS — J029 Acute pharyngitis, unspecified: Secondary | ICD-10-CM | POA: Diagnosis present

## 2022-10-17 LAB — POCT RAPID STREP A (OFFICE): Rapid Strep A Screen: NEGATIVE

## 2022-10-17 MED ORDER — LIDOCAINE VISCOUS HCL 2 % MT SOLN: 5.0000 mL | Freq: Four times a day (QID) | OROMUCOSAL | 0 refills | Status: DC | PRN

## 2022-10-17 MED ORDER — FLUTICASONE PROPIONATE 50 MCG/ACT NA SUSP: 2.0000 | Freq: Every day | NASAL | 0 refills | Status: DC

## 2022-10-17 NOTE — ED Provider Notes (Signed)
[facility]    CSN: [id number] Arrival date & time: 10/17/22  0900      History   Chief Complaint Chief Complaint  Patient presents with   Sore Throat    HPI Michele Phillips is a 32 y.o. female.   The history is provided by the patient.   The patient presents for complaints of fever, chills, body aches, nasal congestion, and runny nose.  Symptoms started over the past 3 days.  Since her symptoms started, she states that the fever, chills, body aches have improved.  She continues to complain of worsening sore throat.  She states that she did go to another urgent care, and was told symptoms are most likely due to a viral infection.  She also states that they performed a COVID test and a strep test which were both negative.  Patient states that she has been taking NyQuil, ibuprofen, Advil, Chloraseptic, and using throat lozenges for throat pain, but symptoms have not improved.  Past Medical History:  Diagnosis Date   BV (bacterial vaginosis)    Hx of chlamydia infection    No pertinent past medical history     Patient Active Problem List   Diagnosis Date Noted   BV (bacterial vaginosis) 12/30/2012   Condyloma acuminatum in female 01/31/2011    Past Surgical History:  Procedure Laterality Date   NO PAST SURGERIES      OB History     Gravida  1   Para  1   Term  1   Preterm  0   AB  0   Living  1      SAB      IAB      Ectopic      Multiple      Live Births  1            Home Medications    Prior to Admission medications   Medication Sig Start Date End Date Taking? Authorizing Provider  acetaminophen (TYLENOL) 325 MG tablet Take 2 tablets (650 mg total) by mouth every 6 (six) hours as needed. 10/05/19  Yes Avegno, Zachery Dakins, FNP  fluticasone (FLONASE) 50 MCG/ACT nasal spray Place 2 sprays into both nostrils daily. 10/17/22  Yes Olanda Downie-Warren, Sadie Haber, NP  levonorgestrel-ethinyl estradiol (SEASONALE) 0.15-0.03 MG tablet Take 1  tablet by mouth daily.   Yes [provider]  lidocaine (XYLOCAINE) 2 % solution Use as directed 5 mLs in the mouth or throat every 6 (six) hours as needed for mouth pain. Gargle and spit 5 mL every 6 hours as needed for throat pain or discomfort. 10/17/22  Yes Vendela Troung-Warren, Sadie Haber, NP  benzonatate (TESSALON) 200 MG capsule Take 1 capsule (200 mg total) by mouth 3 (three) times daily as needed for cough. 05/23/22   Tomma Lightning, MD    Family History Family History  Problem Relation Age of Onset   Diabetes Maternal Grandmother    Hypertension Maternal Grandmother     Social History Social History   Tobacco Use   Smoking status: Never    Passive exposure: Never   Smokeless tobacco: Never  Vaping Use   Vaping Use: Never used  Substance Use Topics   Alcohol use: No   Drug use: No     Allergies   Patient has no known allergies.   Review of Systems Review of Systems Per HPI  Physical Exam Triage Vital Signs ED Triage Vitals  Enc Vitals Group     BP 10/17/22  0935 129/84     Pulse Rate 10/17/22 0935 93     Resp 10/17/22 0935 17     Temp 10/17/22 0935 98.9 F (37.2 C)     Temp Source 10/17/22 0935 Oral     SpO2 10/17/22 0935 97 %     Weight --      Height --      Head Circumference --      Peak Flow --      Pain Score 10/17/22 0936 9     Pain Loc --      Pain Edu? --      Excl. in GC? --    No data found.  Updated Vital Signs BP 129/84 (BP Location: Right Arm)   Pulse 93   Temp 98.9 F (37.2 C) (Oral)   Resp 17   LMP 09/19/2022 (Approximate)   SpO2 97%   Visual Acuity Right Eye Distance:   Left Eye Distance:   Bilateral Distance:    Right Eye Near:   Left Eye Near:    Bilateral Near:     Physical Exam Vitals and nursing note reviewed.  Constitutional:      General: She is not in acute distress.    Appearance: She is well-developed.  HENT:     Head: Normocephalic.     Right Ear: Tympanic membrane and ear canal normal.     Left  Ear: Tympanic membrane and ear canal normal.     Nose: Congestion present. No rhinorrhea.     Mouth/Throat:     Pharynx: Pharyngeal swelling and posterior oropharyngeal erythema present.     Tonsils: No tonsillar exudate. 1+ on the right. 1+ on the left.  Eyes:     Conjunctiva/sclera: Conjunctivae normal.     Pupils: Pupils are equal, round, and reactive to light.  Cardiovascular:     Rate and Rhythm: Normal rate and regular rhythm.     Heart sounds: Normal heart sounds.  Pulmonary:     Effort: Pulmonary effort is normal. No respiratory distress.     Breath sounds: Normal breath sounds. No stridor. No wheezing, rhonchi or rales.  Abdominal:     General: Bowel sounds are normal.     Palpations: Abdomen is soft.     Tenderness: There is no abdominal tenderness.  Musculoskeletal:     Cervical back: Normal range of motion.  Lymphadenopathy:     Cervical: No cervical adenopathy.  Skin:    General: Skin is warm and dry.  Neurological:     General: No focal deficit present.     Mental Status: She is alert and oriented to person, place, and time.  Psychiatric:        Mood and Affect: Mood normal.        Behavior: Behavior normal.      UC Treatments / Results  Labs (all labs ordered are listed, but only abnormal results are displayed) Labs Reviewed  CULTURE, GROUP A STREP [facility])  POCT RAPID STREP A (OFFICE)    EKG   Radiology No results found.  Procedures Procedures (including critical care time)  Medications Ordered in UC Medications - No data to display  Initial Impression / Assessment and Plan / UC Course  I have reviewed the triage vital signs and the nursing notes.  Pertinent labs & imaging results that were available during my care of the patient were reviewed by me and considered in my medical decision making (see chart for details).  The patient is well-appearing,  she is in no acute distress, vital signs are stable.  Rapid strep test is negative, a throat  culture is pending.  Suspect symptoms are most likely due to a viral upper respiratory infection.  Will treat patient with viscous lidocaine 2% to gargle and spit to help with throat pain or discomfort, and fluticasone 50 mcg nasal spray to help with nasal congestion and runny nose.  Patient advised to continue over-the-counter cough and cold medications such as NyQuil.  Also recommend ibuprofen or Advil to help with throat pain or discomfort.  Discussed viral etiology with the patient and when follow-up may be necessary.  Patient is in agreement with this plan of care and verbalizes understanding.  All questions were answered.  Patient stable for discharge.  Work note was provided.    Final Clinical Impressions(s) / UC Diagnoses   Final diagnoses:  Viral illness  Acute pharyngitis, unspecified etiology     Discharge Instructions      The rapid strep test is negative.  A throat culture is pending.  As discussed, if the culture results are positive, you will be contacted and provided treatment. Take medication as prescribed. Continue ibuprofen every 8 hours as needed for throat pain or discomfort.  Recommend taking at least 3 to 4 tablets (600 to 800 mg) every 8 hours. Increase fluids and allow for plenty of rest. Warm salt water gargles 3-4 times daily while throat pain persist. Recommend a soft diet such as soup, broth, yogurt, pudding, Jell-O, or popsicles while throat pain persist. If your throat culture is negative, this most likely is a viral infection.  A viral infection can last from 7 to 14 days.  If symptoms extend beyond that time, or worsen before that time, please follow-up in this clinic or with your primary care physician for further evaluation. Follow-up as needed.     ED Prescriptions     Medication Sig Dispense Auth. Provider   lidocaine (XYLOCAINE) 2 % solution Use as directed 5 mLs in the mouth or throat every 6 (six) hours as needed for mouth pain. Gargle and spit 5  mL every 6 hours as needed for throat pain or discomfort. 100 mL Norton Bivins-Warren, Sadie Haber, NP   fluticasone (FLONASE) 50 MCG/ACT nasal spray Place 2 sprays into both nostrils daily. 16 g Dajanee Voorheis-Warren, Sadie Haber, NP      PDMP not reviewed this encounter.   Abran Cantor, NP 10/17/22 [phone]

## 2022-10-17 NOTE — ED Triage Notes (Signed)
Sore throat, chills, body aches, fever 102, congestion that started Tuesday. Taking tylenol and ibuprofen and OTC sore throat spray with no relief.

## 2022-10-17 NOTE — Discharge Instructions (Addendum)
The rapid strep test is negative.  A throat culture is pending.  As discussed, if the culture results are positive, you will be contacted and provided treatment. Take medication as prescribed. Continue ibuprofen every 8 hours as needed for throat pain or discomfort.  Recommend taking at least 3 to 4 tablets (600 to 800 mg) every 8 hours. Increase fluids and allow for plenty of rest. Warm salt water gargles 3-4 times daily while throat pain persist. Recommend a soft diet such as soup, broth, yogurt, pudding, Jell-O, or popsicles while throat pain persist. If your throat culture is negative, this most likely is a viral infection.  A viral infection can last from 7 to 14 days.  If symptoms extend beyond that time, or worsen before that time, please follow-up in this clinic or with your primary care physician for further evaluation. Follow-up as needed.

## 2022-10-21 LAB — CULTURE, GROUP A STREP (THRC)

## 2022-11-07 ENCOUNTER — Encounter: Payer: Self-pay | Admitting: Nurse Practitioner

## 2023-06-24 NOTE — L&D Delivery Note (Signed)
 OB/GYN Faculty Practice Delivery Note  Michele Phillips is a 33 y.o. G2P1001 s/p SVD at [redacted]w[redacted]d. She was admitted for GDM with poor compliance.   ROM: 0h 24m with clear fluid GBS Status:  Positive/-- (10/07 1641) Maximum Maternal Temperature: afebrile  Labor Progress: Initial SVE: 1.5/50/-3. She then progressed to complete.   Delivery Date/Time: 04/04/2024 9092  Delivery: Called to room and patient was complete and pushing. Head delivered LOA with compound left arm. No nuchal cord present. Shoulder and body delivered in usual fashion. Infant with spontaneous cry, placed on mother's abdomen, dried and stimulated. Cord clamped x 2 after 1-minute delay, and cut by FOB. Cord blood drawn. Placenta delivered spontaneously with gentle cord traction. Fundus firm with massage and Pitocin . Labia, perineum, vagina, and cervix inspected inspected with no lacerations .  Baby Weight: pending  Placenta: Sent to L&D Complications: None Lacerations: none EBL: 200 mL Analgesia: Epidural   Infant:  APGAR (1 MIN): 8  APGAR (5 MINS): 9  APGAR (10 MINS):     [name] J Alok Minshall, DO Center for [facility] 04/04/2024, 9:19 AM

## 2023-08-18 LAB — PREGNANCY, URINE: Preg Test, Ur: POSITIVE

## 2023-09-10 ENCOUNTER — Encounter: Payer: Self-pay | Admitting: *Deleted

## 2023-09-16 ENCOUNTER — Encounter: Payer: Self-pay | Admitting: *Deleted

## 2023-09-23 ENCOUNTER — Telehealth (INDEPENDENT_AMBULATORY_CARE_PROVIDER_SITE_OTHER): Payer: Managed Care, Other (non HMO)

## 2023-09-23 DIAGNOSIS — Z3490 Encounter for supervision of normal pregnancy, unspecified, unspecified trimester: Secondary | ICD-10-CM | POA: Diagnosis not present

## 2023-09-23 DIAGNOSIS — Z3481 Encounter for supervision of other normal pregnancy, first trimester: Secondary | ICD-10-CM | POA: Diagnosis not present

## 2023-09-23 DIAGNOSIS — Z3480 Encounter for supervision of other normal pregnancy, unspecified trimester: Secondary | ICD-10-CM | POA: Insufficient documentation

## 2023-09-23 DIAGNOSIS — Z3A10 10 weeks gestation of pregnancy: Secondary | ICD-10-CM | POA: Diagnosis not present

## 2023-09-23 MED ORDER — GOJJI WEIGHT SCALE MISC: 1.0000 | 0 refills | Status: DC | PRN

## 2023-09-23 MED ORDER — BLOOD PRESSURE KIT DEVI: 1.0000 | 0 refills | Status: AC | PRN

## 2023-09-23 NOTE — Progress Notes (Signed)
 New OB Intake  I connected with Eliezer Lofts  on 09/23/23 at  3:15 PM EDT by MyChart Video Visit and verified that I am speaking with the correct person using two identifiers. Nurse is located at [facility] and pt is located at home.  I discussed the limitations, risks, security and privacy concerns of performing an evaluation and management service by telephone and the availability of in person appointments. I also discussed with the patient that there may be a patient responsible charge related to this service. The patient expressed understanding and agreed to proceed.  I explained I am completing New OB Intake today. We discussed EDD of 04/15/2024, by Last Menstrual Period. Pt is G2P1001. I reviewed her allergies, medications and Medical/Surgical/OB history.    Patient Active Problem List   Diagnosis Date Noted   Supervision of other normal pregnancy, antepartum 09/23/2023   BV (bacterial vaginosis) 12/30/2012   Condyloma acuminatum in female 01/31/2011    Concerns addressed today  Delivery Plans Plans to deliver at [facility]. Discussed the nature of our practice with multiple providers including residents and students. Due to the size of the practice, the delivering provider may not be the same as those providing prenatal care.   Patient is interested in water birth. Offered upcoming OB visit with CNM to discuss further.  MyChart/Babyscripts MyChart access verified. I explained pt will have some visits in office and some virtually. Babyscripts instructions given and order placed. Patient verifies receipt of registration text/e-mail. Account successfully created and app downloaded. If patient is a candidate for Optimized scheduling, add to sticky note.   Blood Pressure Cuff/Weight Scale Blood pressure cuff ordered for patient to pick-up from [employer]. Explained after first prenatal appt pt will check weekly and document in Babyscripts. Patient does not have weight scale; order  sent to [facility], patient may track weight weekly in Babyscripts.  Anatomy US Will message MFM to schedule anatomy ultrasound.    Is patient a CenteringPregnancy candidate?  Declined Declined due to Schedule    Is patient a Mom+Baby Combined Care candidate?  Not a candidate   If accepted, confirm patient does not intend to move from the area for at least 12 months, then notify Mom+Baby staff  Interested in [place]? If yes, send referral and doula dot phrase.   Is patient a candidate for Babyscripts Optimization? Yes, patient declined   First visit review I reviewed new OB appt with patient. Explained pt will be seen by Janetta Hora MD at first visit. Discussed Avelina Laine genetic screening with patient. Yes Panorama and Horizon.. Routine prenatal labs is not   Last Pap Prefer with female provider.   Vidal Schwalbe, [place] 09/23/2023  3:24 PM

## 2023-09-23 NOTE — Addendum Note (Signed)
 Addended byVidal Schwalbe on: 09/23/2023 03:35 PM   Modules accepted: Orders

## 2023-09-24 ENCOUNTER — Ambulatory Visit (INDEPENDENT_AMBULATORY_CARE_PROVIDER_SITE_OTHER)

## 2023-09-24 ENCOUNTER — Other Ambulatory Visit: Payer: Self-pay

## 2023-09-24 DIAGNOSIS — Z3491 Encounter for supervision of normal pregnancy, unspecified, first trimester: Secondary | ICD-10-CM | POA: Diagnosis not present

## 2023-09-24 DIAGNOSIS — Z3A09 9 weeks gestation of pregnancy: Secondary | ICD-10-CM

## 2023-09-24 DIAGNOSIS — Z3A10 10 weeks gestation of pregnancy: Secondary | ICD-10-CM

## 2023-09-30 ENCOUNTER — Ambulatory Visit (INDEPENDENT_AMBULATORY_CARE_PROVIDER_SITE_OTHER): Payer: Self-pay | Admitting: Obstetrics and Gynecology

## 2023-09-30 ENCOUNTER — Other Ambulatory Visit: Payer: Self-pay

## 2023-09-30 ENCOUNTER — Other Ambulatory Visit (HOSPITAL_COMMUNITY)
Admission: RE | Admit: 2023-09-30 | Discharge: 2023-09-30 | Disposition: A | Discharge status: Home / Self Care | Source: Ambulatory Visit | Attending: Obstetrics and Gynecology | Admitting: Obstetrics and Gynecology

## 2023-09-30 ENCOUNTER — Encounter: Payer: Self-pay | Admitting: Obstetrics and Gynecology

## 2023-09-30 VITALS — BP 119/66 | HR 90 | Wt 183.0 lb

## 2023-09-30 DIAGNOSIS — Z3481 Encounter for supervision of other normal pregnancy, first trimester: Secondary | ICD-10-CM

## 2023-09-30 DIAGNOSIS — Z3A11 11 weeks gestation of pregnancy: Secondary | ICD-10-CM | POA: Diagnosis not present

## 2023-09-30 DIAGNOSIS — Z3480 Encounter for supervision of other normal pregnancy, unspecified trimester: Secondary | ICD-10-CM

## 2023-09-30 DIAGNOSIS — Z3401 Encounter for supervision of normal first pregnancy, first trimester: Secondary | ICD-10-CM

## 2023-09-30 DIAGNOSIS — E669 Obesity, unspecified: Secondary | ICD-10-CM | POA: Diagnosis not present

## 2023-09-30 NOTE — Progress Notes (Unsigned)
 New OB Note  09/30/2023   Clinic: Center for [facility]  Chief Complaint: new OB  Transfer of Care Patient: no  History of Present Illness: Michele Phillips is a 33 y.o. G2P1001 at 10/3 weeks (EDC 11/3, based on 9wk u/s). Patient's last menstrual period was 07/10/2023. Preg complicated by has Condyloma acuminatum in female; BV (bacterial vaginosis); Supervision of other normal pregnancy, antepartum; and Obesity (BMI 30-39.9) on their problem list.   No problems or issues  ROS: A 12-point review of systems was performed and negative, except as stated in the above HPI.  OBGYN History: As per HPI. OB History  Gravida Para Term Preterm AB Living  2 1 1  0 0 1  SAB IAB Ectopic Multiple Live Births      1    # Outcome Date GA Lbr Len/2nd Weight Sex Type Anes PTL Lv  2 Current           1 Term 01/15/11 [redacted]w[redacted]d 15:45 / 00:45 6 lb 3.4 oz (2.818 kg) M Vag-Spont EPI  LIV   Prior children are healthy, doing well, and without any problems or issues: yes History of pap smears: unknown.    Past Medical History: Past Medical History:  Diagnosis Date   Hx of chlamydia infection    No pertinent past medical history     Past Surgical History: Past Surgical History:  Procedure Laterality Date   BREAST BIOPSY Left 2016    Family History:  Family History  Problem Relation Age of Onset   Diabetes Maternal Grandmother    Hypertension Maternal Grandmother    Social History:  Social History   Socioeconomic History   Marital status: Single    Spouse name: Not on file   Number of children: Not on file   Years of education: Not on file   Highest education level: Not on file  Occupational History   Not on file  Tobacco Use   Smoking status: Never    Passive exposure: Never   Smokeless tobacco: Never  Vaping Use   Vaping status: Never Used  Substance and Sexual Activity   Alcohol use: No   Drug use: No   Sexual activity: Yes    Partners: Male    Birth  control/protection: Pill  Other Topics Concern   Not on file  Social History Narrative   Not on file   Social Drivers of Health   Financial Resource Strain: Not on file  Food Insecurity: Not on file  Transportation Needs: Not on file  Physical Activity: Not on file  Stress: Not on file  Social Connections: Not on file  Intimate Partner Violence: Not on file   Allergy: No Known Allergies   Current Outpatient Medications: Prenatal vitamin  Physical Exam:   BP 119/66   Pulse 90   Wt 183 lb (83 kg)   LMP 07/10/2023   BMI 33.47 kg/m  Body mass index is 33.47 kg/m. Contractions: Not present Vag. Bleeding: None. FHTs: 160s  General appearance: Well nourished, well developed female in no acute distress.  Neck:  Supple, normal appearance, and no thyromegaly  Cardiovascular: S1, S2 normal, no murmur, rub or gallop, regular rate and rhythm Respiratory:  Clear to auscultation bilateral. Normal respiratory effort Abdomen: positive bowel sounds and no masses, hernias; diffusely non tender to palpation, non distended Breasts: patient declines to have breast exam. Neuro/Psych:  Normal mood and affect.  Skin:  Warm and dry.   Pelvic exam: declined  Laboratory: none  Imaging:  As per HPI  Assessment: patient stable  Plan: 1. Supervision of other normal pregnancy, antepartum (Primary) Offer afp >15wks. Declines pap today, offer PP. EDC updated to be based off early u/s Patient elects for baby scripts - CBC/D/Plt+RPR+Rh+ABO+RubIgG... - Hemoglobin A1c - Culture, OB Urine - GC/Chlamydia probe amp ([place])not at [facility] - Comprehensive metabolic panel with GFR - Protein / creatinine ratio, urine - TSH Rfx on Abnormal to Free T4  2. [redacted] weeks gestation of pregnancy - CBC/D/Plt+RPR+Rh+ABO+RubIgG... - Hemoglobin A1c - Culture, OB Urine - GC/Chlamydia probe amp ([place])not at [facility] - Comprehensive metabolic panel with GFR - Protein / creatinine ratio, urine - TSH  Rfx on Abnormal to Free T4  3. Encounter for supervision of other normal pregnancy in first trimester  - PANORAMA PRENATAL TEST  4. Supervision of normal first pregnancy in first trimester - HORIZON Basic Panel  5. Obesity (BMI 30-39.9) D/w her re: low dose ASA. Pt to consider  Problem list reviewed and updated.  Follow up in 4 weeks.  >50% of 30 min visit spent on counseling and coordination of care.  Return in about 8 weeks (around 11/25/2023) for in person, low risk ob, md or app.  Future Appointments  Date Time Provider Department Center  11/20/2023  8:00 AM [facility] NURSE INTAKE [facility]  11/25/2023  2:00 PM [facility] PROVIDER 1 [facility]  11/25/2023  2:30 PM [facility] US2 [facility]    Michele Copa MD Attending Center for [facility])

## 2023-09-30 NOTE — Patient Instructions (Signed)

## 2023-10-01 ENCOUNTER — Encounter: Payer: Self-pay | Admitting: Obstetrics and Gynecology

## 2023-10-01 LAB — CBC/D/PLT+RPR+RH+ABO+RUBIGG...
Antibody Screen: NEGATIVE
Basophils Absolute: 0 10*3/uL (ref 0.0–0.2)
Basos: 0 %
EOS (ABSOLUTE): 0.1 10*3/uL (ref 0.0–0.4)
Eos: 1 %
HCV Ab: NONREACTIVE
HIV Screen 4th Generation wRfx: NONREACTIVE
Hematocrit: 33.2 % — ABNORMAL LOW (ref 34.0–46.6)
Hemoglobin: 11 g/dL — ABNORMAL LOW (ref 11.1–15.9)
Hepatitis B Surface Ag: NEGATIVE
Immature Grans (Abs): 0 10*3/uL (ref 0.0–0.1)
Immature Granulocytes: 0 %
Lymphocytes Absolute: 2 10*3/uL (ref 0.7–3.1)
Lymphs: 25 %
MCH: 27.8 pg (ref 26.6–33.0)
MCHC: 33.1 g/dL (ref 31.5–35.7)
MCV: 84 fL (ref 79–97)
Monocytes Absolute: 0.6 10*3/uL (ref 0.1–0.9)
Monocytes: 7 %
Neutrophils Absolute: 5.4 10*3/uL (ref 1.4–7.0)
Neutrophils: 67 %
Platelets: 376 10*3/uL (ref 150–450)
RBC: 3.95 x10E6/uL (ref 3.77–5.28)
RDW: 13.2 % (ref 11.7–15.4)
RPR Ser Ql: NONREACTIVE
Rh Factor: POSITIVE
Rubella Antibodies, IGG: 6.07 {index} (ref >0.99)
WBC: 8 10*3/uL (ref 3.4–10.8)

## 2023-10-01 LAB — COMPREHENSIVE METABOLIC PANEL WITH GFR
ALT: 6 IU/L (ref 0–32)
AST: 8 IU/L (ref 0–40)
Albumin: 4 g/dL (ref 3.9–4.9)
Alkaline Phosphatase: 70 IU/L (ref 44–121)
BUN/Creatinine Ratio: 10 (ref 9–23)
BUN: 5 mg/dL — ABNORMAL LOW (ref 6–20)
Bilirubin Total: 0.2 mg/dL (ref 0.0–1.2)
CO2: 20 mmol/L (ref 20–29)
Calcium: 9.5 mg/dL (ref 8.7–10.2)
Chloride: 103 mmol/L (ref 96–106)
Creatinine, Ser: 0.49 mg/dL — ABNORMAL LOW (ref 0.57–1.00)
Globulin, Total: 2.3 g/dL (ref 1.5–4.5)
Glucose: 88 mg/dL (ref 70–99)
Potassium: 4.3 mmol/L (ref 3.5–5.2)
Sodium: 136 mmol/L (ref 134–144)
Total Protein: 6.3 g/dL (ref 6.0–8.5)
eGFR: 128 mL/min/{1.73_m2} (ref >59)

## 2023-10-01 LAB — GC/CHLAMYDIA PROBE AMP (~~LOC~~) NOT AT ARMC
Chlamydia: NEGATIVE
Comment: NEGATIVE
Comment: NORMAL
Neisseria Gonorrhea: NEGATIVE

## 2023-10-01 LAB — HCV INTERPRETATION

## 2023-10-01 LAB — PROTEIN / CREATININE RATIO, URINE
Creatinine, Urine: 137.4 mg/dL
Protein, Ur: 8.9 mg/dL
Protein/Creat Ratio: 65 mg/g{creat} (ref 0–200)

## 2023-10-01 LAB — HEMOGLOBIN A1C
Est. average glucose Bld gHb Est-mCnc: 128 mg/dL
Hgb A1c MFr Bld: 6.1 % — ABNORMAL HIGH (ref 4.8–5.6)

## 2023-10-01 NOTE — Addendum Note (Signed)
 Addended byVidal Schwalbe on: 10/01/2023 05:00 PM   Modules accepted: Orders

## 2023-10-02 ENCOUNTER — Encounter: Payer: Self-pay | Admitting: Obstetrics and Gynecology

## 2023-10-02 ENCOUNTER — Telehealth: Payer: Self-pay

## 2023-10-02 DIAGNOSIS — O24419 Gestational diabetes mellitus in pregnancy, unspecified control: Secondary | ICD-10-CM | POA: Insufficient documentation

## 2023-10-02 DIAGNOSIS — O24415 Gestational diabetes mellitus in pregnancy, controlled by oral hypoglycemic drugs: Secondary | ICD-10-CM | POA: Insufficient documentation

## 2023-10-02 DIAGNOSIS — O99810 Abnormal glucose complicating pregnancy: Secondary | ICD-10-CM | POA: Insufficient documentation

## 2023-10-02 LAB — URINE CULTURE, OB REFLEX: Organism ID, Bacteria: NO GROWTH

## 2023-10-02 LAB — CULTURE, OB URINE

## 2023-10-02 NOTE — Telephone Encounter (Addendum)
 Called patient regarding recent lab results. Informed patient hemoglobin A1c was elevated and physician advised to do a early 2 hour glucose test. Patient scheduled for  10/15/23 at 8:20 AM. Informed patient to not eat or drink after midnight before her appointment. Patient verbalized understanding and confirmed scheduled appointment.   Marcelino Duster, RN  ----- Message from Nemacolin Bing sent at 10/02/2023 12:26 AM EDT ----- Needs early 2h GTT scheduled

## 2023-10-09 LAB — PANORAMA PRENATAL TEST FULL PANEL:PANORAMA TEST PLUS 5 ADDITIONAL MICRODELETIONS: FETAL FRACTION: 7.1

## 2023-10-13 ENCOUNTER — Other Ambulatory Visit: Payer: Self-pay | Admitting: Obstetrics and Gynecology

## 2023-10-13 DIAGNOSIS — O99012 Anemia complicating pregnancy, second trimester: Secondary | ICD-10-CM

## 2023-10-13 DIAGNOSIS — O99810 Abnormal glucose complicating pregnancy: Secondary | ICD-10-CM

## 2023-10-13 LAB — HORIZON CUSTOM: REPORT SUMMARY: NEGATIVE

## 2023-10-15 ENCOUNTER — Other Ambulatory Visit: Payer: Self-pay

## 2023-10-15 ENCOUNTER — Other Ambulatory Visit

## 2023-10-15 DIAGNOSIS — O99012 Anemia complicating pregnancy, second trimester: Secondary | ICD-10-CM

## 2023-10-15 DIAGNOSIS — O99810 Abnormal glucose complicating pregnancy: Secondary | ICD-10-CM | POA: Diagnosis not present

## 2023-10-16 LAB — GLUCOSE TOLERANCE, 2 HOURS W/ 1HR
Glucose, 1 hour: 169 mg/dL (ref 70–179)
Glucose, 2 hour: 131 mg/dL (ref 70–152)
Glucose, Fasting: 100 mg/dL — ABNORMAL HIGH (ref 70–91)

## 2023-10-16 LAB — ANEMIA PROFILE B
Basophils Absolute: 0 10*3/uL (ref 0.0–0.2)
Basos: 0 %
EOS (ABSOLUTE): 0 10*3/uL (ref 0.0–0.4)
Eos: 0 %
Ferritin: 248 ng/mL — ABNORMAL HIGH (ref 15–150)
Folate: >20 ng/mL (ref >3.0)
Hematocrit: 32.1 % — ABNORMAL LOW (ref 34.0–46.6)
Hemoglobin: 10.4 g/dL — ABNORMAL LOW (ref 11.1–15.9)
Immature Grans (Abs): 0 10*3/uL (ref 0.0–0.1)
Immature Granulocytes: 0 %
Iron Saturation: 28 % (ref 15–55)
Iron: 78 ug/dL (ref 27–159)
Lymphocytes Absolute: 1.7 10*3/uL (ref 0.7–3.1)
Lymphs: 24 %
MCH: 27.7 pg (ref 26.6–33.0)
MCHC: 32.4 g/dL (ref 31.5–35.7)
MCV: 86 fL (ref 79–97)
Monocytes Absolute: 0.5 10*3/uL (ref 0.1–0.9)
Monocytes: 7 %
Neutrophils Absolute: 4.9 10*3/uL (ref 1.4–7.0)
Neutrophils: 69 %
Platelets: 351 10*3/uL (ref 150–450)
RBC: 3.75 x10E6/uL — ABNORMAL LOW (ref 3.77–5.28)
RDW: 13.3 % (ref 11.7–15.4)
Retic Ct Pct: 1.8 % (ref 0.6–2.6)
Total Iron Binding Capacity: 283 ug/dL (ref 250–450)
UIBC: 205 ug/dL (ref 131–425)
Vitamin B-12: 227 pg/mL — ABNORMAL LOW (ref 232–1245)
WBC: 7.1 10*3/uL (ref 3.4–10.8)

## 2023-10-17 ENCOUNTER — Encounter: Payer: Self-pay | Admitting: Obstetrics and Gynecology

## 2023-10-19 ENCOUNTER — Telehealth: Payer: Self-pay

## 2023-10-19 DIAGNOSIS — O24410 Gestational diabetes mellitus in pregnancy, diet controlled: Secondary | ICD-10-CM

## 2023-10-19 MED ORDER — ACCU-CHEK SOFTCLIX LANCETS MISC: 12 refills | Status: DC

## 2023-10-19 MED ORDER — ACCU-CHEK GUIDE W/DEVICE KIT: 1.0000 | PACK | 0 refills | Status: DC | PRN

## 2023-10-19 MED ORDER — ACCU-CHEK GUIDE TEST VI STRP
ORAL_STRIP | 12 refills | Status: DC
Start: 2023-10-19 — End: 2024-04-06

## 2023-10-19 NOTE — Telephone Encounter (Addendum)
-----   Message from Raynell Caller sent at 10/17/2023 10:43 PM EDT ----- Patient with early GDM. Please set up with supplies, classes, etc  Pt notified results and pt agreed to 10/22/23 at 1315 for Diabetes Education and advised to bring testing supplies with her to her appt. Pt verbalized understanding with no further questions.   Lamontae Ricardo,RN

## 2023-10-21 NOTE — Progress Notes (Signed)
 Patient was seen for Gestational Diabetes/ on 10/22/2023   Start time 1310 and End time 1416   Estimated due date: 04/25/2024; [redacted]w[redacted]d   Clinical: Medications:  Current Outpatient Medications:    Accu-Chek Softclix Lancets lancets, Use as instructed, Disp: 100 each, Rfl: 12   Blood Glucose Monitoring Suppl (ACCU-CHEK GUIDE) w/Device KIT, 1 Device by Does not apply route as needed., Disp: 1 kit, Rfl: 0   Blood Pressure Monitoring (BLOOD PRESSURE KIT) DEVI, 1 Device by Does not apply route as needed., Disp: 1 each, Rfl: 0   glucose blood (ACCU-CHEK GUIDE TEST) test strip, Use as instructed, Disp: 100 each, Rfl: 12   Prenatal Vit-Fe Fumarate-FA (PRENATAL PO), Take by mouth., Disp: , Rfl:    acetaminophen  (TYLENOL ) 325 MG tablet, Take 2 tablets (650 mg total) by mouth every 6 (six) hours as needed. (Patient not taking: Reported on 10/22/2023), Disp: 45 tablet, Rfl: 0   Misc. Devices (GOJJI WEIGHT SCALE) MISC, 1 Device by Does not apply route as needed., Disp: 1 each, Rfl: 0  Medical History:  Past Medical History:  Diagnosis Date   Hx of chlamydia infection    No pertinent past medical history    Labs: OGTT fasting 100, 1 hour 169, 2 hour 131 on 10/15/2023 Lab Results  Component Value Date   HGBA1C 6.1 (H) 09/30/2023    Dietary and Lifestyle History: Pt presents today with accu chek her significant other, Phil. Pt reports working full time mostly sitting. Pt reports she has decreased intake of sugary sweetened soda and states intake of juice daily All Pt's questions were answered during this encounter.     Physical Activity: walks at work  Stress: 3 out of 10 /self care naps,  Sleep: "great"  24 hr Recall:  First Meal:  strawberry refresher, 1 egg bite with bacon and cheese Snack:  none Second meal:  ~12:20 pm 1 slice of pepperoni pizza, water Snack:  green apple, peanut butter Third meal: [employer] pop pie or chicken tenders, mashed potatoes or [employer] fried cobb, minute maid  Snack:   none Beverages:  water, juice, starbucks refresher, minute maid  NUTRITION INTERVENTION  Nutrition education (E-1) on the following topics:   Initial Follow-up  [x]  []  Definition of Gestational Diabetes [x]  []  Why dietary management is important in controlling blood glucose [x]  []  Effects each nutrient has on blood glucose levels [x]  []  Simple carbohydrates vs complex carbohydrates [x]  []  Fluid intake [x]  []  Creating a balanced meal plan [x]  []  Carbohydrate counting  [x]  []  When to check blood glucose levels [x]  []  Proper blood glucose monitoring techniques [x]  []  Effect of stress and stress reduction techniques  [x]  []  Exercise effect on blood glucose levels, appropriate exercise during pregnancy [x]  []  Importance of limiting caffeine and abstaining from alcohol and smoking []  []  Medications used for blood sugar control during pregnancy [x]  []  Hypoglycemia and rule of 15 [x]  []  Postpartum self care   Patient has a meter prior to visit. Patient is instructed to begin testing pre breakfast and 2 hours after each meal. CBG: 125 mg/dL, reported as 1 hour post prandial per Pt    Patient instructed to monitor glucose levels: QID FBS: 60 - <= 95 mg/dL; 2 hour: <= 829 mg/dL  Patient received handouts: Nutrition Diabetes and Pregnancy Carbohydrate Counting List Blood glucose log Snack ideas for diabetes during pregnancy  Patient will be seen for follow-up as needed.

## 2023-10-22 ENCOUNTER — Other Ambulatory Visit: Payer: Self-pay

## 2023-10-22 ENCOUNTER — Encounter: Attending: Obstetrics and Gynecology | Admitting: Dietician

## 2023-10-22 ENCOUNTER — Ambulatory Visit

## 2023-10-22 DIAGNOSIS — Z3A00 Weeks of gestation of pregnancy not specified: Secondary | ICD-10-CM | POA: Insufficient documentation

## 2023-10-22 DIAGNOSIS — O24410 Gestational diabetes mellitus in pregnancy, diet controlled: Secondary | ICD-10-CM | POA: Diagnosis not present

## 2023-10-22 DIAGNOSIS — Z713 Dietary counseling and surveillance: Secondary | ICD-10-CM | POA: Insufficient documentation

## 2023-10-22 DIAGNOSIS — Z3A13 13 weeks gestation of pregnancy: Secondary | ICD-10-CM

## 2023-10-26 ENCOUNTER — Encounter: Payer: Self-pay | Admitting: *Deleted

## 2023-11-11 NOTE — Progress Notes (Unsigned)
 Patient was seen for Gestational Diabetes on   Start time 1310 and End time   Estimated due date: 04/25/2024; w d   Clinical: Medications:  Current Outpatient Medications:    Accu-Chek Softclix Lancets lancets, Use as instructed, Disp: 100 each, Rfl: 12   acetaminophen  (TYLENOL ) 325 MG tablet, Take 2 tablets (650 mg total) by mouth every 6 (six) hours as needed. (Patient not taking: Reported on 10/22/2023), Disp: 45 tablet, Rfl: 0   Blood Glucose Monitoring Suppl (ACCU-CHEK GUIDE) w/Device KIT, 1 Device by Does not apply route as needed., Disp: 1 kit, Rfl: 0   Blood Pressure Monitoring (BLOOD PRESSURE KIT) DEVI, 1 Device by Does not apply route as needed., Disp: 1 each, Rfl: 0   glucose blood (ACCU-CHEK GUIDE TEST) test strip, Use as instructed, Disp: 100 each, Rfl: 12   Misc. Devices (GOJJI WEIGHT SCALE) MISC, 1 Device by Does not apply route as needed., Disp: 1 each, Rfl: 0   Prenatal Vit-Fe Fumarate-FA (PRENATAL PO), Take by mouth., Disp: , Rfl:   Medical History:  Past Medical History:  Diagnosis Date   Hx of chlamydia infection    No pertinent past medical history    Labs: OGTT fasting 100, 1 hour 169, 2 hour 131 on 10/15/2023 Lab Results  Component Value Date   HGBA1C 6.1 (H) 09/30/2023    Dietary and Lifestyle History: Pt presents today with accu chek her significant other, Phil. Pt reports working full time mostly sitting. Pt reports she has decreased intake of sugary sweetened soda and states intake of juice daily All Pt's questions were answered during this encounter.     Physical Activity: walks at work  Stress: 3 out of 10 /self care naps,  Sleep: "great"  24 hr Recall:  First Meal:  strawberry refresher, 1 egg bite with bacon and cheese Snack:  none Second meal:  ~12:20 pm 1 slice of pepperoni pizza, water Snack:  green apple, peanut butter Third meal: [employer] pop pie or chicken tenders, mashed potatoes or [employer] fried cobb, minute maid  Snack:  none Beverages:   water, juice, starbucks refresher, minute maid  NUTRITION INTERVENTION  Nutrition education (E-1) on the following topics:   Initial Follow-up  [x]  []  Definition of Gestational Diabetes [x]  []  Why dietary management is important in controlling blood glucose [x]  []  Effects each nutrient has on blood glucose levels [x]  []  Simple carbohydrates vs complex carbohydrates [x]  []  Fluid intake [x]  []  Creating a balanced meal plan [x]  []  Carbohydrate counting  [x]  []  When to check blood glucose levels [x]  []  Proper blood glucose monitoring techniques [x]  []  Effect of stress and stress reduction techniques  [x]  []  Exercise effect on blood glucose levels, appropriate exercise during pregnancy [x]  []  Importance of limiting caffeine and abstaining from alcohol and smoking []  []  Medications used for blood sugar control during pregnancy [x]  []  Hypoglycemia and rule of 15 [x]  []  Postpartum self care   Patient has a meter prior to visit. Patient is instructed to begin testing pre breakfast and 2 hours after each meal. CBG: 125 mg/dL, reported as 1 hour post prandial per Pt    Patient instructed to monitor glucose levels: QID FBS: 60 - <= 95 mg/dL; 2 hour: <= 409 mg/dL  Patient received handouts: Nutrition Diabetes and Pregnancy Carbohydrate Counting List Blood glucose log Snack ideas for diabetes during pregnancy  Patient will be seen for follow-up 11/19/2023

## 2023-11-19 ENCOUNTER — Encounter: Admitting: Dietician

## 2023-11-19 ENCOUNTER — Ambulatory Visit (INDEPENDENT_AMBULATORY_CARE_PROVIDER_SITE_OTHER): Payer: Self-pay | Admitting: Dietician

## 2023-11-19 DIAGNOSIS — Z3A17 17 weeks gestation of pregnancy: Secondary | ICD-10-CM

## 2023-11-19 DIAGNOSIS — O24410 Gestational diabetes mellitus in pregnancy, diet controlled: Secondary | ICD-10-CM | POA: Diagnosis not present

## 2023-11-19 DIAGNOSIS — Z713 Dietary counseling and surveillance: Secondary | ICD-10-CM | POA: Diagnosis not present

## 2023-11-19 DIAGNOSIS — Z3A00 Weeks of gestation of pregnancy not specified: Secondary | ICD-10-CM | POA: Diagnosis not present

## 2023-11-20 ENCOUNTER — Telehealth: Admitting: Family Medicine

## 2023-11-20 ENCOUNTER — Inpatient Hospital Stay (HOSPITAL_COMMUNITY)
Admission: AD | Admit: 2023-11-20 | Discharge: 2023-11-20 | Disposition: A | Discharge status: Home / Self Care | Attending: Obstetrics and Gynecology | Admitting: Obstetrics and Gynecology

## 2023-11-20 ENCOUNTER — Ambulatory Visit

## 2023-11-20 DIAGNOSIS — O26892 Other specified pregnancy related conditions, second trimester: Secondary | ICD-10-CM | POA: Diagnosis not present

## 2023-11-20 DIAGNOSIS — Z3A17 17 weeks gestation of pregnancy: Secondary | ICD-10-CM | POA: Diagnosis not present

## 2023-11-20 DIAGNOSIS — O99891 Dorsalgia, unspecified: Secondary | ICD-10-CM

## 2023-11-20 DIAGNOSIS — M545 Low back pain, unspecified: Secondary | ICD-10-CM | POA: Diagnosis not present

## 2023-11-20 LAB — URINALYSIS, ROUTINE W REFLEX MICROSCOPIC
Bilirubin Urine: NEGATIVE
Glucose, UA: NEGATIVE mg/dL
Hgb urine dipstick: NEGATIVE
Ketones, ur: 20 mg/dL — AB
Leukocytes,Ua: NEGATIVE
Nitrite: NEGATIVE
Protein, ur: NEGATIVE mg/dL
Specific Gravity, Urine: 1.011 (ref 1.005–1.030)
pH: 6 (ref 5.0–8.0)

## 2023-11-20 MED ORDER — CYCLOBENZAPRINE HCL 10 MG PO TABS: 10.0000 mg | ORAL_TABLET | Freq: Three times a day (TID) | ORAL | 0 refills | Status: DC | PRN

## 2023-11-20 MED ORDER — CYCLOBENZAPRINE HCL 5 MG PO TABS
10.0000 mg | ORAL_TABLET | Freq: Once | ORAL | Status: DC
  Filled 2023-11-20: qty 2

## 2023-11-20 MED ORDER — ACETAMINOPHEN 500 MG PO TABS
1000.0000 mg | ORAL_TABLET | Freq: Once | ORAL | Status: AC
  Administered 2023-11-20: 1000 mg via ORAL
  Filled 2023-11-20: qty 2

## 2023-11-20 NOTE — Progress Notes (Signed)
 Pt is concerned about back pain and is [redacted] weeks pregnant. She is advised to call her OB. DWB

## 2023-11-20 NOTE — MAU Provider Note (Signed)
 Chief Complaint: Lower back pain  SUBJECTIVE HPI: Michele Phillips is a 33 y.o. G2P1001 at [redacted]w[redacted]d by early ultrasound who presents to maternity admissions reporting central lower back pain earlier today. Hx of similar back pain since last delivery in 2012 off and on. Worsen today at work but now improved. Denies LE weakness, radiation, saddle anesthesia. Denies pelvic pressure, cramping or pain. Feeling baby move.   She denies vaginal bleeding, vaginal itching/burning, urinary symptoms, h/a, dizziness, n/v, or fever/chills.    HPI  Past Medical History:  Diagnosis Date   Hx of chlamydia infection    No pertinent past medical history    Past Surgical History:  Procedure Laterality Date   BREAST BIOPSY Left 2016   Social History   Socioeconomic History   Marital status: Single    Spouse name: Not on file   Number of children: Not on file   Years of education: Not on file   Highest education level: Not on file  Occupational History   Not on file  Tobacco Use   Smoking status: Never    Passive exposure: Never   Smokeless tobacco: Never  Vaping Use   Vaping status: Never Used  Substance and Sexual Activity   Alcohol use: No   Drug use: No   Sexual activity: Yes    Partners: Male    Birth control/protection: Pill  Other Topics Concern   Not on file  Social History Narrative   Not on file   Social Drivers of Health   Financial Resource Strain: Not on file  Food Insecurity: Not on file  Transportation Needs: Not on file  Physical Activity: Not on file  Stress: Not on file  Social Connections: Not on file  Intimate Partner Violence: Not on file   No current facility-administered medications on file prior to encounter.   Current Outpatient Medications on File Prior to Encounter  Medication Sig Dispense Refill   Accu-Chek Softclix Lancets lancets Use as instructed 100 each 12   acetaminophen  (TYLENOL ) 325 MG tablet Take 2 tablets (650 mg total) by mouth every 6 (six)  hours as needed. (Patient not taking: Reported on 10/22/2023) 45 tablet 0   Blood Glucose Monitoring Suppl (ACCU-CHEK GUIDE) w/Device KIT 1 Device by Does not apply route as needed. 1 kit 0   Blood Pressure Monitoring (BLOOD PRESSURE KIT) DEVI 1 Device by Does not apply route as needed. 1 each 0   glucose blood (ACCU-CHEK GUIDE TEST) test strip Use as instructed 100 each 12   Misc. Devices (GOJJI WEIGHT SCALE) MISC 1 Device by Does not apply route as needed. 1 each 0   Prenatal Vit-Fe Fumarate-FA (PRENATAL PO) Take by mouth.     No Known Allergies  ROS:  Pertinent positives/negatives listed above.  I have reviewed patient's Past Medical Hx, Surgical Hx, Family Hx, Social Hx, medications and allergies.   Physical Exam  Patient Vitals for the past 24 hrs:  BP Temp Temp src Pulse Resp Weight  11/20/23 1952 118/62 98 F (36.7 C) Oral 82 12 81.5 kg   Constitutional: Well-developed, well-nourished female in no acute distress.  Cardiovascular: normal rate Respiratory: normal effort GI: Abd soft, non-tender. Pos BS x 4 MS: Extremities nontender, no edema, normal ROM Neurologic: Alert and oriented x 4. Normal gait. 5/5 LE strength. GU: Neg CVAT.  Doppler: 156  LAB RESULTS Results for orders placed or performed during the hospital encounter of 11/20/23 (from the past 24 hours)  Urinalysis, Routine w reflex microscopic -Urine,  Clean Catch     Status: Abnormal   Collection Time: 11/20/23  8:32 PM  Result Value Ref Range   Color, Urine YELLOW YELLOW   APPearance HAZY (A) CLEAR   Specific Gravity, Urine 1.011 1.005 - 1.030   pH 6.0 5.0 - 8.0   Glucose, UA NEGATIVE NEGATIVE mg/dL   Hgb urine dipstick NEGATIVE NEGATIVE   Bilirubin Urine NEGATIVE NEGATIVE   Ketones, ur 20 (A) NEGATIVE mg/dL   Protein, ur NEGATIVE NEGATIVE mg/dL   Nitrite NEGATIVE NEGATIVE   Leukocytes,Ua NEGATIVE NEGATIVE    O/Positive/-- (04/09 1539)  IMAGING No results found.  MAU Management/MDM: Orders Placed  This Encounter  Procedures   Urinalysis, Routine w reflex microscopic -Urine, Clean Catch    Meds ordered this encounter  Medications   acetaminophen  (TYLENOL ) tablet 1,000 mg   cyclobenzaprine (FLEXERIL) tablet 10 mg   cyclobenzaprine (FLEXERIL) 10 MG tablet    Sig: Take 1 tablet (10 mg total) by mouth 3 (three) times daily as needed for muscle spasms.    Dispense:  30 tablet    Refill:  0     ASSESSMENT 1. Back pain affecting pregnancy in second trimester   2. [redacted] weeks gestation of pregnancy   Back pain improved after Tylenol .  Suspect MSK pain from paraspinal muscle spasm, and poor mechanics with weak core strength.  Encouraged pregnancy yoga, cat/cow, core tenses with wall stands or hands and knees positioning - demonstrated.  Encouraged belly band.  Encouraged square lifting and pulling technique - demonstrated. Encouraged to ask for PT referral if no improvement with home exercises  UA negative.  PLAN Discharge home with strict return precautions. Continue routine prenatal care as scheduled  Allergies as of 11/20/2023   No Known Allergies      Medication List     TAKE these medications    Accu-Chek Guide Test test strip Generic drug: glucose blood Use as instructed   Accu-Chek Guide w/Device Kit 1 Device by Does not apply route as needed.   Accu-Chek Softclix Lancets lancets Use as instructed   acetaminophen  325 MG tablet Commonly known as: Tylenol  Take 2 tablets (650 mg total) by mouth every 6 (six) hours as needed.   Blood Pressure Kit Devi 1 Device by Does not apply route as needed.   cyclobenzaprine 10 MG tablet Commonly known as: FLEXERIL Take 1 tablet (10 mg total) by mouth 3 (three) times daily as needed for muscle spasms.   Gojji Weight Scale Misc 1 Device by Does not apply route as needed.   PRENATAL PO Take by mouth.        Follow-up Information     Center for [facility] Follow up.    Specialty: Obstetrics and Gynecology Why: As scheduled for ongoing prenatal care Contact information: [address] [place]  [phone]                Darrow End, MD FMOB Fellow, Faculty practice [facility], Center for [facility]  11/20/2023  9:07 PM

## 2023-11-20 NOTE — MAU Note (Signed)
 Pt says she feels back pain - started 2 weeks ago-  [facility]- clinic. She called them today at 545pm- told to come here.  Meds- none  Pain - 7/10 Last sex- Monday . No vag D/C

## 2023-11-20 NOTE — Discharge Instructions (Addendum)
 BACK PAIN: Back pain is a very common issue that can start or worsen during pregnancy. I would recommend the following: -Tylenol  500 mg every 4 hours OR Tylenol  1000 mg every 6 hours -Flexeril 10 mg every 8 hours as needed -Gentle stretching. You can find prenatal yoga videos online to follow along! -Heating pads can be helpful as well.  If your back pain is still severe even after these things, talk with your OB about other options!                    Safe Medications in Pregnancy    Acne: Benzoyl Peroxide Salicylic Acid  Backache/Headache: Tylenol : 2 regular strength every 4 hours OR              2 Extra strength every 6 hours  Colds/Coughs/Allergies: Benadryl  (alcohol free) 25 mg every 6 hours as needed Breath right strips Claritin Cepacol throat lozenges Chloraseptic throat spray Cold-Eeze- up to three times per day Cough drops, alcohol free Flonase  (by prescription only) Guaifenesin Mucinex Robitussin DM (plain only, alcohol free) Saline nasal spray/drops Sudafed (pseudoephedrine) & Actifed ** use only after [redacted] weeks gestation and if you do not have high blood pressure Tylenol  Vicks Vaporub Zinc lozenges Zyrtec   Constipation: Colace Ducolax suppositories Fleet enema Glycerin  suppositories Metamucil Milk of magnesia Miralax Senokot Smooth move tea  Diarrhea: Kaopectate Imodium A-D  *NO pepto Bismol  Hemorrhoids: Anusol Anusol HC Preparation H Tucks  Indigestion: Tums Maalox Mylanta Zantac  Pepcid  Insomnia: Benadryl  (alcohol free) 25mg  every 6 hours as needed Tylenol  PM Unisom, no Gelcaps  Leg Cramps: Tums MagGel  Nausea/Vomiting:  Bonine Dramamine Emetrol Ginger extract Sea bands Meclizine  Nausea medication to take during pregnancy:  Unisom (doxylamine succinate 25 mg tablets) Take one tablet daily at bedtime. If symptoms are not adequately controlled, the dose can be increased to a maximum recommended dose of two tablets  daily (1/2 tablet in the morning, 1/2 tablet mid-afternoon and one at bedtime). Vitamin B6 100mg  tablets. Take one tablet twice a day (up to 200 mg per day).  Skin Rashes: Aveeno products Benadryl  cream or 25mg  every 6 hours as needed Calamine Lotion 1% cortisone cream  Yeast infection: Gyne-lotrimin 7 Monistat 7   **If taking multiple medications, please check labels to avoid duplicating the same active ingredients **take medication as directed on the label ** Do not exceed 4000 mg of tylenol  in 24 hours **Do not take medications that contain aspirin or ibuprofen 

## 2023-11-25 ENCOUNTER — Ambulatory Visit (INDEPENDENT_AMBULATORY_CARE_PROVIDER_SITE_OTHER): Admitting: Family Medicine

## 2023-11-25 ENCOUNTER — Other Ambulatory Visit: Payer: Self-pay | Admitting: *Deleted

## 2023-11-25 ENCOUNTER — Ambulatory Visit: Attending: Obstetrics and Gynecology | Admitting: Maternal & Fetal Medicine

## 2023-11-25 ENCOUNTER — Ambulatory Visit

## 2023-11-25 ENCOUNTER — Other Ambulatory Visit: Payer: Self-pay

## 2023-11-25 VITALS — BP 107/67 | HR 91

## 2023-11-25 VITALS — BP 126/75 | HR 84 | Wt 179.1 lb

## 2023-11-25 DIAGNOSIS — Z3480 Encounter for supervision of other normal pregnancy, unspecified trimester: Secondary | ICD-10-CM

## 2023-11-25 DIAGNOSIS — O24410 Gestational diabetes mellitus in pregnancy, diet controlled: Secondary | ICD-10-CM | POA: Insufficient documentation

## 2023-11-25 DIAGNOSIS — Z3A18 18 weeks gestation of pregnancy: Secondary | ICD-10-CM | POA: Diagnosis not present

## 2023-11-25 DIAGNOSIS — E669 Obesity, unspecified: Secondary | ICD-10-CM

## 2023-11-25 DIAGNOSIS — O24419 Gestational diabetes mellitus in pregnancy, unspecified control: Secondary | ICD-10-CM

## 2023-11-25 DIAGNOSIS — O99012 Anemia complicating pregnancy, second trimester: Secondary | ICD-10-CM

## 2023-11-25 DIAGNOSIS — Z363 Encounter for antenatal screening for malformations: Secondary | ICD-10-CM | POA: Diagnosis not present

## 2023-11-25 DIAGNOSIS — D649 Anemia, unspecified: Secondary | ICD-10-CM

## 2023-11-25 DIAGNOSIS — O99212 Obesity complicating pregnancy, second trimester: Secondary | ICD-10-CM | POA: Insufficient documentation

## 2023-11-25 NOTE — Progress Notes (Signed)
 Patient information  Patient Name: Michele Phillips  Patient MRN:   [id number]  Referring practice: MFM Referring Provider: [facility] [place] [facility])  MFM CONSULT  Michele Phillips is a 33 y.o. G2P1001 at [redacted]w[redacted]d here for ultrasound and consultation. Patient Active Problem List   Diagnosis Date Noted   GDM diagnosed a 12 weeks 10/02/2023   Obesity (BMI 30-39.9) 09/30/2023   Supervision of other normal pregnancy, antepartum 09/23/2023   Condyloma acuminatum in female 01/31/2011   Michele Phillips is doing well today with no acute concerns.  The patient was diagnosed with prediabetes and failed an early 2-hour glucose test.  A1c was 6.1.  I discussed this confers a slight increased risk of congenital heart disease and a fetal echo is indicated.  I discussed the potential complications associated with suboptimally controlled gestational diabetes.  The patient will continue to improve her diet, exercise and consider medication if necessary to achieve at least 50% of blood sugar control.  Currently her fasting blood sugars are in the 100s and her postprandials are all nearly at goal.  Sonographic findings Single intrauterine pregnancy at 18w 3d. Fetal cardiac activity:  Observed and appears normal. Presentation: Cephalic. The anatomic structures that were well seen appear normal without evidence of soft markers. The anatomic survey is complete.  Fetal biometry shows the estimated fetal weight at the 50 percentile. Amniotic fluid: Within normal limits.  MVP: 6.15 cm. Placenta: Anterior. Adnexa: No adnexal mass visualized. Cervical length: 3.9 cm.  There are limitations of prenatal ultrasound such as the inability to detect certain abnormalities due to poor visualization. Various factors such as fetal position, gestational age and maternal body habitus may increase the difficulty in visualizing the fetal anatomy.    Recommendations -EDD should be 04/24/2024 based on  Early  Ultrasound  (09/24/23) -Detailed ultrasound was done today without abnormalities. -Baseline preeclampsia labs: CMP, CBC and urine protein/creatinine ratio if not previously completed.  -Consider starting NPH at night if fasting blood sugars continues to be elevated -Aspirin 81 mg for preeclampsia prophylaxis -Follow-up anatomy and fetal growth in 4 to 6 weeks -Serial growth ultrasounds starting around 28 weeks to monitor for fetal growth restriction -Fetal echo has been arranged at [facility] -Antenatal testing to start around 32 weeks if medications are required to achieve proper glycemic control -Delivery timing pending clinical course  -Continue routine prenatal care with referring OB provider  Review of Systems: A review of systems was performed and was negative except per HPI   Vitals and Physical Exam    11/25/2023    3:56 PM 11/25/2023    2:15 PM 11/20/2023    7:52 PM  Vitals with BMI  Weight 179 lbs 2 oz  179 lbs 10 oz  Systolic 126 107 409  Diastolic 75 67 62  Pulse 84 91 82    Sitting comfortably on the sonogram table Nonlabored breathing Normal rate and rhythm Abdomen is nontender  Past pregnancies OB History  Gravida Para Term Preterm AB Living  2 1 1  0 0 1  SAB IAB Ectopic Multiple Live Births      1    # Outcome Date GA Lbr Len/2nd Weight Sex Type Anes PTL Lv  2 Current           1 Term 01/15/11 [redacted]w[redacted]d 15:45 / 00:45 6 lb 3.4 oz (2.818 kg) M Vag-Spont EPI  LIV     I spent 30 minutes reviewing the patients chart, including labs and images as  well as counseling the patient about her medical conditions. Greater than 50% of the time was spent in direct face-to-face patient counseling.  Penney Bowling, DO Maternal fetal medicine, [place]   11/25/2023  4:07 PM

## 2023-11-25 NOTE — Progress Notes (Signed)
   PRENATAL VISIT NOTE  Subjective:  Michele Phillips is a 33 y.o. G2P1001 at [redacted]w[redacted]d being seen today for ongoing prenatal care.  She is currently monitored for the following issues for this high-risk pregnancy and has Condyloma acuminatum in female; Supervision of other normal pregnancy, antepartum; Obesity (BMI 30-39.9); GDM diagnosed a 12 weeks; and Anemia during pregnancy in second trimester on their problem list.  Patient reports no bleeding, no contractions, no cramping, and no leaking.  Contractions: Not present. Vag. Bleeding: None.  Movement: Absent. Denies leaking of fluid.   The following portions of the patient's history were reviewed and updated as appropriate: allergies, current medications, past family history, past medical history, past social history, past surgical history and problem list.   Objective:    Vitals:   11/25/23 1556  BP: 126/75  Pulse: 84  Weight: 179 lb 2 oz (81.3 kg)    Fetal Status:  Fetal Heart Rate (bpm): 160   Movement: Absent    General: Alert, oriented and cooperative. Patient is in no acute distress.  Skin: Skin is warm and dry. No rash noted.   Cardiovascular: Normal heart rate noted  Respiratory: Normal respiratory effort, no problems with respiration noted  Abdomen: Soft, gravid, appropriate for gestational age.  Pain/Pressure: Absent     Pelvic: Cervical exam deferred        Extremities: Normal range of motion.  Edema: None  Mental Status: Normal mood and affect. Normal behavior. Normal judgment and thought content.   Assessment and Plan:  Pregnancy: G2P1001 at [redacted]w[redacted]d 1. Supervision of other normal pregnancy, antepartum (Primary) FHR and BP appropriate today - AFP, Serum, Open Spina Bifida  2. Anemia during pregnancy in second trimester Last hemoglobin was 11.0 Repeat anemia labs at 28-week visit  3. Diet controlled gestational diabetes mellitus (GDM) in second trimester Patient reports fasting blood sugars around 100.  Postprandials  all less than 120 per patient's report.  She did not bring her log today.  Discussed starting 500 mg metformin but patient would like to track her blood sugars over the next 2 weeks and we will follow-up and consider initiation of medications at that time if needed. -Repeat growth ultrasound in 4 to 6 weeks - Fetal echo with [facility] - Will need serial growth scans every 4 weeks starting at 28 weeks  4. [redacted] weeks gestation of pregnancy   Preterm labor symptoms and general obstetric precautions including but not limited to vaginal bleeding, contractions, leaking of fluid and fetal movement were reviewed in detail with the patient. Please refer to After Visit Summary for other counseling recommendations.   No follow-ups on file.  Future Appointments  Date Time Provider Department Center  12/17/2023  8:15 AM [facility]  12/23/2023  3:15 PM Curlie D[facility]  01/20/2024  3:15 PM Raynell Caller, MD [facility]    Ferdie Housekeeper, MD

## 2023-11-26 ENCOUNTER — Encounter: Admitting: Obstetrics and Gynecology

## 2023-11-27 LAB — AFP, SERUM, OPEN SPINA BIFIDA
AFP MoM: 0.71
AFP Value: 31.6 ng/mL
Gest. Age on Collection Date: 18.3 wk
Maternal Age At EDD: 34.3 a
OSBR Risk 1 IN: 10000
Test Results:: NEGATIVE
Weight: 179 [lb_av]

## 2023-12-16 ENCOUNTER — Other Ambulatory Visit: Payer: Self-pay

## 2023-12-16 DIAGNOSIS — O24419 Gestational diabetes mellitus in pregnancy, unspecified control: Secondary | ICD-10-CM

## 2023-12-16 NOTE — Progress Notes (Unsigned)
 Patient was seen for Gestational Diabetes on 12/24/2023   Start time 1515 and End time 1545  Estimated due date: 04/25/2024; [redacted]w[redacted]d  Clinical: Medications:  Current Outpatient Medications:    Accu-Chek Softclix Lancets lancets, Use as instructed, Disp: 100 each, Rfl: 12   acetaminophen  (TYLENOL ) 325 MG tablet, Take 2 tablets (650 mg total) by mouth every 6 (six) hours as needed., Disp: 45 tablet, Rfl: 0   Blood Glucose Monitoring Suppl (ACCU-CHEK GUIDE) w/Device KIT, 1 Device by Does not apply route as needed., Disp: 1 kit, Rfl: 0   Blood Pressure Monitoring (BLOOD PRESSURE KIT) DEVI, 1 Device by Does not apply route as needed., Disp: 1 each, Rfl: 0   cyclobenzaprine  (FLEXERIL ) 10 MG tablet, Take 1 tablet (10 mg total) by mouth 3 (three) times daily as needed for muscle spasms. (Patient not taking: Reported on 11/25/2023), Disp: 30 tablet, Rfl: 0   glucose blood (ACCU-CHEK GUIDE TEST) test strip, Use as instructed, Disp: 100 each, Rfl: 12   Misc. Devices (GOJJI WEIGHT SCALE) MISC, 1 Device by Does not apply route as needed., Disp: 1 each, Rfl: 0   Prenatal Vit-Fe Fumarate-FA (PRENATAL PO), Take by mouth., Disp: , Rfl:   Medical History:  Past Medical History:  Diagnosis Date   Hx of chlamydia infection    No pertinent past medical history    Labs: OGTT fasting 100, 1 hour 169, 2 hour 131 on 10/15/2023 Lab Results  Component Value Date   HGBA1C 6.1 (H) 09/30/2023   Dietary and Lifestyle History:  Pt presents today with her significant other. Pt reports she has d/c taking prenatal MVI before monitoring her fasting blood sugar stating she feels is was causing elevations before breakfast. Pt reports she recently changed jobs and states working full time combined sitting and standing. Pt reports current intake of 3 meals with 2 snacks most days. Pt reports she has increased water and is avoiding intake of sugary sweetened beverages. RD congratulated for her record keeping and encouraged Pt to  continue to monitor and log her blood sugars QID. Pt denies carb counting and states using the plate planner mostly at dinner-RD reviewed the bite method for additional strategies for balanced meal intake. Pt reports she reviews labels nutrition labels for carbohydrate content when shopping. All Pt's questions were answered during this encounter.   Physical Activity: walking 5 days weekly for 15 minutes Stress: 5 out of 10 /self care:  naps Sleep: great  24 hr Recall:  First Meal: boiled egg, flavored water (100 mg/dL reported as 2 hour post prandial) or chic fil la par fait with nuts cranberries and raisins, hash brown (117 mg/dL 2 hour post prandial)  Snack:  grapes or melon, cheese  Second meal: egg bite from star bucks, yogurt parfait with berries (110 mg/dL reported as 2 hour post prandial)   Third meal: chipolte bowl with chicken, brown rice, lettuce, tomato, corn salsa, fajita vegetables (105 mg/dL reported as 2 hour post prandial)  Snack:  2 peanut butter nabs, water  Beverages:  water, sparling water, flavored water  NUTRITION INTERVENTION  Nutrition education (E-1) on the following topics:   Initial Follow-up  [x]  []  Definition of Gestational Diabetes [x]  [x]  Why dietary management is important in controlling blood glucose [x]  [x]  Effects each nutrient has on blood glucose levels [x]  []  Simple carbohydrates vs complex carbohydrates [x]  [x]  Fluid intake [x]  [x]  Creating a balanced meal plan [x]  []  Carbohydrate counting  [x]  [x]  When to check blood glucose levels [  x] []  Proper blood glucose monitoring techniques [x]  []  Effect of stress and stress reduction techniques  [x]  [x]  Exercise effect on blood glucose levels, appropriate exercise during pregnancy [x]  []  Importance of limiting caffeine and abstaining from alcohol and smoking []  [x]  Medications used for blood sugar control during pregnancy [x]  [x]  Hypoglycemia and rule of 15 [x]  []  Postpartum self care   Patient has  a meter prior to visit. Patient is instructed to begin testing pre breakfast and 2 hours after each meal.   Patient instructed to monitor glucose levels: QID FBS: 60 - <= 95 mg/dL; 2 hour: <= 879 mg/dL  Patient will be seen for follow-up 02/04/2024

## 2023-12-17 ENCOUNTER — Other Ambulatory Visit: Payer: Self-pay

## 2023-12-20 ENCOUNTER — Encounter (HOSPITAL_COMMUNITY): Payer: Self-pay | Admitting: Obstetrics and Gynecology

## 2023-12-20 ENCOUNTER — Inpatient Hospital Stay (HOSPITAL_COMMUNITY)
Admission: AD | Admit: 2023-12-20 | Discharge: 2023-12-20 | Disposition: A | Discharge status: Home / Self Care | Attending: Obstetrics and Gynecology | Admitting: Obstetrics and Gynecology

## 2023-12-20 DIAGNOSIS — Z3492 Encounter for supervision of normal pregnancy, unspecified, second trimester: Secondary | ICD-10-CM

## 2023-12-20 DIAGNOSIS — Z3A22 22 weeks gestation of pregnancy: Secondary | ICD-10-CM | POA: Diagnosis not present

## 2023-12-20 DIAGNOSIS — O219 Vomiting of pregnancy, unspecified: Secondary | ICD-10-CM | POA: Diagnosis not present

## 2023-12-20 DIAGNOSIS — R109 Unspecified abdominal pain: Secondary | ICD-10-CM | POA: Diagnosis present

## 2023-12-20 HISTORY — DX: Gestational diabetes mellitus in pregnancy, unspecified control: O24.419

## 2023-12-20 LAB — URINALYSIS, ROUTINE W REFLEX MICROSCOPIC
Bilirubin Urine: NEGATIVE
Glucose, UA: NEGATIVE mg/dL
Ketones, ur: 20 mg/dL — AB
Nitrite: NEGATIVE
Protein, ur: NEGATIVE mg/dL
RBC / HPF: >50 RBC/hpf (ref 0–5)
Specific Gravity, Urine: 1.017 (ref 1.005–1.030)
pH: 6 (ref 5.0–8.0)

## 2023-12-20 LAB — WET PREP, GENITAL
Clue Cells Wet Prep HPF POC: NONE SEEN
Sperm: NONE SEEN
Trich, Wet Prep: NONE SEEN
WBC, Wet Prep HPF POC: <10 (ref <10)
Yeast Wet Prep HPF POC: NONE SEEN

## 2023-12-20 MED ORDER — ONDANSETRON 4 MG PO TBDP
8.0000 mg | ORAL_TABLET | Freq: Once | ORAL | Status: AC
  Administered 2023-12-20: 8 mg via ORAL
  Filled 2023-12-20: qty 2

## 2023-12-20 MED ORDER — DOCUSATE SODIUM 250 MG PO CAPS: 250.0000 mg | ORAL_CAPSULE | Freq: Every day | ORAL | 0 refills | Status: DC

## 2023-12-20 MED ORDER — ONDANSETRON HCL 4 MG PO TABS: 4.0000 mg | ORAL_TABLET | Freq: Three times a day (TID) | ORAL | 0 refills | Status: DC | PRN

## 2023-12-20 NOTE — Discharge Instructions (Addendum)
 Michele Phillips,  It was a pleasure meeting you in MAU today. I'm glad you are feeling better. I am sending you a prescription for Zofran  4mg . You may use this as needed for symptoms of nausea, but it can cause some constipation. I have also sent a stool softener to try to help with this, and recommend drinking plenty of fluid and supplementing with fiber. I like psyllium husk fiber - you can get the powder or capsules if needed.   Thank you for trusting us  to care for you, Camie, Midwife

## 2023-12-20 NOTE — MAU Note (Signed)
 Michele Phillips is a 33 y.o. at [redacted]w[redacted]d here in MAU reporting: having really bad cramps, is throwing up and is shaking.  No bleeding, d/c or LOF.  No diarrhea or fever.   Onset of complaint: 0400 with cramps Pain score: severe Vitals:   12/20/23 0840  BP: 131/69  Pulse: 82  Resp: 20  Temp: 97.7 F (36.5 C)  SpO2: 100%     FHT:150 Lab orders placed from triage:  urine

## 2023-12-20 NOTE — MAU Provider Note (Signed)
 Chief Complaint:  Abdominal Pain and Emesis   HPI   None     Michele Phillips is a 33 y.o. G2P1001 at [redacted]w[redacted]d who presents to maternity admissions reporting nausea with vomiting. She reports 4-5 episodes of vomiting in the past 24 hours. She reports no knowledge eating any under cooked or poorly prepared foods, she denies any sick contacts.  She has  been voiding normally. Her bowel movements have been normal. She is also having abdominal cramping. This cramping started around 0400 and is intense in nature. She reports last intercourse was greater than a week prior She reports she is having no changes in vaginal discharge.  Reports she has not previously had nausea and vomiting this pregnancy.   Took 1000mg  of Tylenol  at home with no relief. Does not still have flexeril  at home.   Endorses feeling regular and vigorous fetal movement  Pregnancy Course: MCW,   Past Medical History:  Diagnosis Date   Gestational diabetes    Hx of chlamydia infection    No pertinent past medical history    OB History  Gravida Para Term Preterm AB Living  2 1 1  0 0 1  SAB IAB Ectopic Multiple Live Births      1    # Outcome Date GA Lbr Len/2nd Weight Sex Type Anes PTL Lv  2 Current           1 Term 01/15/11 [redacted]w[redacted]d 15:45 / 00:45 2818 g M Vag-Spont EPI  LIV   Past Surgical History:  Procedure Laterality Date   BREAST BIOPSY Left 2016   Family History  Problem Relation Age of Onset   Healthy Mother    Healthy Father    Diabetes Maternal Grandmother    Hypertension Maternal Grandmother    Social History   Tobacco Use   Smoking status: Never    Passive exposure: Never   Smokeless tobacco: Never  Vaping Use   Vaping status: Never Used  Substance Use Topics   Alcohol use: No   Drug use: No   No Known Allergies No medications prior to admission.    I have reviewed patient's Past Medical Hx, Surgical Hx, Family Hx, Social Hx, medications and allergies.   ROS  Pertinent items noted in HPI  and remainder of comprehensive ROS otherwise negative.   PHYSICAL EXAM  Patient Vitals for the past 24 hrs:  BP Temp Temp src Pulse Resp SpO2 Height Weight  12/20/23 0840 131/69 97.7 F (36.5 C) Oral 82 20 100 % 5' 2 (1.575 m) 81.8 kg    Physical Exam Vitals and nursing note reviewed.  Constitutional:      Appearance: Normal appearance.  HENT:     Head: Normocephalic.   Cardiovascular:     Rate and Rhythm: Normal rate.     Pulses: Normal pulses.  Pulmonary:     Effort: Pulmonary effort is normal.     Breath sounds: Normal breath sounds.  Abdominal:     General: Bowel sounds are normal.     Palpations: Abdomen is soft.     Tenderness: There is abdominal tenderness in the left lower quadrant. There is no right CVA tenderness, left CVA tenderness, guarding or rebound. Negative signs include Murphy's sign, Rovsing's sign, McBurney's sign and obturator sign.     Hernia: No hernia is present.     Comments: Gravid   Skin:    General: Skin is warm and dry.     Capillary Refill: Capillary refill takes less than 2  seconds.   Neurological:     General: No focal deficit present.     Mental Status: She is alert and oriented to person, place, and time.   Psychiatric:        Mood and Affect: Mood normal.        Behavior: Behavior normal.        Thought Content: Thought content normal.        Judgment: Judgment normal.      Dilation: Closed Effacement (%): Thick Cervical Position: Posterior Station: Ballotable Presentation: Undeterminable Exam by:: S Warren-Hill, CNM  Fetal Tracing: Baseline: 150 via doppler in triage    Labs: Results for orders placed or performed during the hospital encounter of 12/20/23 (from the past 24 hours)  Urinalysis, Routine w reflex microscopic -Urine, Clean Catch     Status: Abnormal   Collection Time: 12/20/23  8:45 AM  Result Value Ref Range   Color, Urine YELLOW YELLOW   APPearance CLOUDY (A) CLEAR   Specific Gravity, Urine 1.017 1.005 -  1.030   pH 6.0 5.0 - 8.0   Glucose, UA NEGATIVE NEGATIVE mg/dL   Hgb urine dipstick LARGE (A) NEGATIVE   Bilirubin Urine NEGATIVE NEGATIVE   Ketones, ur 20 (A) NEGATIVE mg/dL   Protein, ur NEGATIVE NEGATIVE mg/dL   Nitrite NEGATIVE NEGATIVE   Leukocytes,Ua SMALL (A) NEGATIVE   RBC / HPF >50 0 - 5 RBC/hpf   WBC, UA 6-10 0 - 5 WBC/hpf   Bacteria, UA FEW (A) NONE SEEN   Squamous Epithelial / HPF 11-20 0 - 5 /HPF   Mucus PRESENT   Wet prep, genital     Status: None   Collection Time: 12/20/23  9:46 AM  Result Value Ref Range   Yeast Wet Prep HPF POC NONE SEEN NONE SEEN   Trich, Wet Prep NONE SEEN NONE SEEN   Clue Cells Wet Prep HPF POC NONE SEEN NONE SEEN   WBC, Wet Prep HPF POC <10 <10   Sperm NONE SEEN     Imaging:  No results found.  MDM & MAU COURSE  MDM: Low suspicion for PTL. FFN collected prior to exam and vaginal swabs. Will send PRN.  Trial of Zofran  for nausea. Consider fluids PRN for dehydration if apparent in UA.  No vaginal infection appreciated on wet prep swabs.  No dehydration appreciated on UA and Zofran  very effective for nausea and pain.   MAU Course: Orders Placed This Encounter  Procedures   Wet prep, genital   Urinalysis, Routine w reflex microscopic -Urine, Clean Catch   Discharge patient   Meds ordered this encounter  Medications   ondansetron  (ZOFRAN -ODT) disintegrating tablet 8 mg   ondansetron  (ZOFRAN ) 4 MG tablet    Sig: Take 1 tablet (4 mg total) by mouth every 8 (eight) hours as needed for nausea or vomiting.    Dispense:  20 tablet    Refill:  0    Supervising Provider:   JAYNE MINDER H [[id number]]   docusate sodium  (COLACE) 250 MG capsule    Sig: Take 1 capsule (250 mg total) by mouth daily.    Dispense:  10 capsule    Refill:  0    Supervising Provider:   JAYNE MINDER H [[id number]]    ASSESSMENT   1. Nausea and vomiting during pregnancy   2. [redacted] weeks gestation of pregnancy   3. Movement of fetus present during pregnancy in second  trimester   4. Presence of fetal heart sounds in second trimester  Wet prep WNL  PLAN  Discharge home in stable condition.  - Prescription for Zofran  4mg  ODT q6h PRN sent to pharmacy for nausea.     Allergies as of 12/20/2023   No Known Allergies      Medication List     STOP taking these medications    cyclobenzaprine  10 MG tablet Commonly known as: FLEXERIL        TAKE these medications    Accu-Chek Guide Test test strip Generic drug: glucose blood Use as instructed   Accu-Chek Guide w/Device Kit 1 Device by Does not apply route as needed.   Accu-Chek Softclix Lancets lancets Use as instructed   acetaminophen  325 MG tablet Commonly known as: Tylenol  Take 2 tablets (650 mg total) by mouth every 6 (six) hours as needed. What changed: how much to take   Blood Pressure Kit Devi 1 Device by Does not apply route as needed.   docusate sodium  250 MG capsule Commonly known as: COLACE Take 1 capsule (250 mg total) by mouth daily.   Gojji Weight Scale Misc 1 Device by Does not apply route as needed.   ondansetron  4 MG tablet Commonly known as: Zofran  Take 1 tablet (4 mg total) by mouth every 8 (eight) hours as needed for nausea or vomiting.   PRENATAL PO Take by mouth.        Camie Rote, MSN, CNM 12/20/2023 5:17 PM  Certified Nurse Midwife, [facility]

## 2023-12-22 LAB — GC/CHLAMYDIA PROBE AMP (~~LOC~~) NOT AT ARMC
Chlamydia: NEGATIVE
Comment: NEGATIVE
Comment: NORMAL
Neisseria Gonorrhea: NEGATIVE

## 2023-12-23 ENCOUNTER — Encounter: Admitting: Certified Nurse Midwife

## 2023-12-24 ENCOUNTER — Encounter: Attending: Obstetrics and Gynecology | Admitting: Dietician

## 2023-12-24 ENCOUNTER — Ambulatory Visit: Admitting: Dietician

## 2023-12-24 ENCOUNTER — Ambulatory Visit: Admitting: Obstetrics and Gynecology

## 2023-12-24 ENCOUNTER — Other Ambulatory Visit: Payer: Self-pay

## 2023-12-24 VITALS — BP 112/72 | HR 84 | Wt 181.4 lb

## 2023-12-24 DIAGNOSIS — Z0373 Encounter for suspected fetal anomaly ruled out: Secondary | ICD-10-CM | POA: Diagnosis not present

## 2023-12-24 DIAGNOSIS — Z713 Dietary counseling and surveillance: Secondary | ICD-10-CM | POA: Diagnosis not present

## 2023-12-24 DIAGNOSIS — Z3A00 Weeks of gestation of pregnancy not specified: Secondary | ICD-10-CM | POA: Diagnosis not present

## 2023-12-24 DIAGNOSIS — Z3A22 22 weeks gestation of pregnancy: Secondary | ICD-10-CM

## 2023-12-24 DIAGNOSIS — O24419 Gestational diabetes mellitus in pregnancy, unspecified control: Secondary | ICD-10-CM | POA: Diagnosis not present

## 2023-12-24 DIAGNOSIS — Z3480 Encounter for supervision of other normal pregnancy, unspecified trimester: Secondary | ICD-10-CM

## 2023-12-24 DIAGNOSIS — O24410 Gestational diabetes mellitus in pregnancy, diet controlled: Secondary | ICD-10-CM | POA: Insufficient documentation

## 2023-12-24 NOTE — Patient Instructions (Signed)
   Considering Waterbirth? Guide for patients at [facility]) Why consider waterbirth? Gentle birth for babies  Less pain medicine used in labor  May allow for passive descent/less pushing  May reduce perineal tears  More mobility and instinctive maternal position changes  Increased maternal relaxation   Is waterbirth safe? What are the risks of infection, drowning or other complications? Infection:  Very low risk (3.7 % for tub vs 4.8% for bed)  7 in 8000 waterbirths with documented infection  Poorly cleaned equipment most common cause  Slightly lower group B strep transmission rate  Drowning  Maternal:  Very low risk  Related to seizures or fainting  Newborn:  Very low risk. No evidence of increased risk of respiratory problems in multiple large studies  Physiological protection from breathing under water  Avoid underwater birth if there are any fetal complications  Once baby's head is out of the water, keep it out.  Birth complication  Some reports of cord trauma, but risk decreased by bringing baby to surface gradually  No evidence of increased risk of shoulder dystocia. Mothers can usually change positions faster in water than in a bed, possibly aiding the maneuvers to free the shoulder.   There are 2 things you MUST do to have a waterbirth with [facility]: Attend a waterbirth class at [facility]   3rd Wednesday of every month from 7-9 pm (virtual during COVID) [employer] at [email or web address] or [email or web address] or by calling [phone] Bring [place] the certificate from the class to your prenatal appointment or send via MyChart Meet with a midwife at 36 weeks* to see if you can still plan a waterbirth and to sign the consent.   *We also recommend that you schedule as many of your prenatal visits with a midwife as possible.    Helpful information: You may want to bring a bathing suit top to the hospital  to wear during labor but this is optional.  All other supplies are provided by the hospital. Please arrive at the hospital with signs of active labor, and do not wait at home until late in labor. It takes 45 min- 1 hour for fetal monitoring, and check in to your room to take place, plus transport and filling of the waterbirth tub.    Things that would prevent you from having a waterbirth: Premature, <37wks  Previous cesarean birth  Presence of thick meconium-stained fluid  Multiple gestation (Twins, triplets, etc.)  Uncontrolled diabetes or gestational diabetes requiring medication  Hypertension diagnosed in pregnancy or preexisting hypertension (gestational hypertension, preeclampsia, or chronic hypertension) Fetal growth restriction (your baby measures less than 10th percentile on ultrasound) Heavy vaginal bleeding  Non-reassuring fetal heart rate  Active infection (MRSA, etc.). Group B Strep is NOT a contraindication for waterbirth.  If your labor has to be induced and induction method requires continuous monitoring of the baby's heart rate  Other risks/issues identified by your obstetrical provider   Please remember that birth is unpredictable. Under certain unforeseeable circumstances your provider may advise against giving birth in the tub. These decisions will be made on a case-by-case basis and with the safety of you and your baby as our highest priority.    Updated 09/25/21

## 2023-12-24 NOTE — Progress Notes (Signed)
   PRENATAL VISIT NOTE  Subjective:  Michele Phillips is a 33 y.o. G2P1001 at [redacted]w[redacted]d being seen today for ongoing prenatal care.  She is currently monitored for the following issues for this low-risk pregnancy and has Condyloma acuminatum in female; Supervision of other normal pregnancy, antepartum; Obesity (BMI 30-39.9); GDM diagnosed a 12 weeks; and Anemia during pregnancy in second trimester on their problem list.  Patient reports .   . Vag. Bleeding: None.  Movement: Present. Denies leaking of fluid.   The following portions of the patient's history were reviewed and updated as appropriate: allergies, current medications, past family history, past medical history, past social history, past surgical history and problem list.   Objective:    Vitals:   12/24/23 1623  BP: 112/72  Pulse: 84  Weight: 181 lb 6.4 oz (82.3 kg)    Fetal Status:  Fetal Heart Rate (bpm): 144   Movement: Present    General: Alert, oriented and cooperative. Patient is in no acute distress.  Skin: Skin is warm and dry. No rash noted.   Cardiovascular: Normal heart rate noted  Respiratory: Normal respiratory effort, no problems with respiration noted  Abdomen: Soft, gravid, appropriate for gestational age.  Pain/Pressure: Absent     Pelvic: Cervical exam deferred        Extremities: Normal range of motion.     Mental Status: Normal mood and affect. Normal behavior. Normal judgment and thought content.   Assessment and Plan:  Pregnancy: G2P1001 at [redacted]w[redacted]d 1. Supervision of other normal pregnancy, antepartum (Primary) BP and FHR normal Doing well, feeling regular movement    2. Gestational diabetes mellitus (GDM) in second trimester, gestational diabetes method of control unspecified Follow up ultrasound 7/30 Fetal echo arranged at [facility]- normal Fasting and pp majority normal continue nutrition and exercise efforts   3. [redacted] weeks gestation of pregnancy Desires BTL, will discuss with MD next visit   Preterm  labor symptoms and general obstetric precautions including but not limited to vaginal bleeding, contractions, leaking of fluid and fetal movement were reviewed in detail with the patient. Please refer to After Visit Summary for other counseling recommendations.   No follow-ups on file.  Future Appointments  Date Time Provider Department Center  01/20/2024  1:00 PM [facility] PROVIDER 1 [facility]  01/20/2024  1:30 PM [facility] US2 [facility]  01/20/2024  3:15 PM Izell Harari, MD [facility]  02/04/2024  3:15 PM [facility]    Nidia Daring, FNP

## 2024-01-06 ENCOUNTER — Ambulatory Visit

## 2024-01-06 ENCOUNTER — Other Ambulatory Visit

## 2024-01-20 ENCOUNTER — Ambulatory Visit: Payer: Self-pay | Admitting: Obstetrics and Gynecology

## 2024-01-20 ENCOUNTER — Other Ambulatory Visit: Payer: Self-pay | Admitting: *Deleted

## 2024-01-20 ENCOUNTER — Ambulatory Visit: Attending: Obstetrics and Gynecology | Admitting: Maternal & Fetal Medicine

## 2024-01-20 ENCOUNTER — Ambulatory Visit

## 2024-01-20 VITALS — BP 112/64 | HR 81

## 2024-01-20 VITALS — Wt 180.0 lb

## 2024-01-20 DIAGNOSIS — O99212 Obesity complicating pregnancy, second trimester: Secondary | ICD-10-CM

## 2024-01-20 DIAGNOSIS — O99012 Anemia complicating pregnancy, second trimester: Secondary | ICD-10-CM

## 2024-01-20 DIAGNOSIS — O24410 Gestational diabetes mellitus in pregnancy, diet controlled: Secondary | ICD-10-CM | POA: Diagnosis not present

## 2024-01-20 DIAGNOSIS — E669 Obesity, unspecified: Secondary | ICD-10-CM

## 2024-01-20 DIAGNOSIS — Z3A26 26 weeks gestation of pregnancy: Secondary | ICD-10-CM | POA: Diagnosis not present

## 2024-01-20 DIAGNOSIS — O24419 Gestational diabetes mellitus in pregnancy, unspecified control: Secondary | ICD-10-CM

## 2024-01-20 DIAGNOSIS — Z3480 Encounter for supervision of other normal pregnancy, unspecified trimester: Secondary | ICD-10-CM

## 2024-01-20 NOTE — Progress Notes (Signed)
   PRENATAL VISIT NOTE  Subjective:  Michele Phillips is a 33 y.o. G2P1001 at [redacted]w[redacted]d being seen today for ongoing prenatal care.  She is currently monitored for the following issues for this high-risk pregnancy and has Condyloma acuminatum in female; Supervision of other normal pregnancy, antepartum; Obesity (BMI 30-39.9); GDM diagnosed a 12 weeks; and Anemia during pregnancy in second trimester on their problem list.  Patient reports no complaints.  Contractions: Not present. Vag. Bleeding: None.  Movement: Present. Denies leaking of fluid.   The following portions of the patient's history were reviewed and updated as appropriate: allergies, current medications, past family history, past medical history, past social history, past surgical history and problem list.   Objective:    Vitals:   01/20/24 1442  Weight: 180 lb (81.6 kg)    Fetal Status:  Fetal Heart Rate (bpm): 156   Movement: Present    General: Alert, oriented and cooperative. Patient is in no acute distress.  Skin: Skin is warm and dry. No rash noted.   Cardiovascular: Normal heart rate noted  Respiratory: Normal respiratory effort, no problems with respiration noted  Abdomen: Soft, gravid, appropriate for gestational age.  Pain/Pressure: Absent     Pelvic: Cervical exam deferred        Extremities: Normal range of motion.  Edema: None  Mental Status: Normal mood and affect. Normal behavior. Normal judgment and thought content.   Assessment and Plan:  Pregnancy: G2P1001 at [redacted]w[redacted]d 1. [redacted] weeks gestation of pregnancy (Primary) Ask about birth control next visit.  28wk labs needed next visit. Needs anemia profile b at that time    Latest Ref Rng & Units 10/15/2023    8:24 AM 09/30/2023    3:39 PM 01/15/2011    5:10 AM  CBC  WBC 3.4 - 10.8 x10E3/uL 7.1  8.0  12.1   Hemoglobin 11.1 - 15.9 g/dL 89.5  88.9  88.6   Hematocrit 34.0 - 46.6 % 32.1  33.2  33.6   Platelets 150 - 450 x10E3/uL 351  376  227    2. Gestational  diabetes mellitus (GDM) in second trimester, gestational diabetes method of control unspecified Doing well on no meds. Normal log today and normal mfm growth u/s earlier today  3. Anemia during pregnancy in second trimester  4. Obesity (BMI 30-39.9)  5. Supervision of other normal pregnancy, antepartum  Preterm labor symptoms and general obstetric precautions including but not limited to vaginal bleeding, contractions, leaking of fluid and fetal movement were reviewed in detail with the patient. Please refer to After Visit Summary for other counseling recommendations.   Return in 15 days (on 02/04/2024) for in person, high risk ob, md or app.  Future Appointments  Date Time Provider Department Center  02/04/2024  1:55 PM Izell Harari, MD [facility]  02/04/2024  3:15 PM [facility]  02/18/2024  1:15 PM [facility] PROVIDER 1 [facility]  02/18/2024  1:30 PM [facility] US5 [facility]  02/18/2024  2:35 PM Izell Harari, MD [facility]  03/17/2024  1:15 PM [facility] PROVIDER 1 [facility]  03/17/2024  1:30 PM [facility] US5 [facility]  04/14/2024  1:15 PM [facility] PROVIDER 1 [facility]  04/14/2024  1:30 PM [facility] US5 [facility]    Harari Izell, MD

## 2024-01-20 NOTE — Progress Notes (Signed)
   Patient information  Patient Name: Michele Phillips  Patient MRN:   [id number]  Referring practice: MFM Referring Provider: [facility])  Problem List   Patient Active Problem List   Diagnosis Date Noted   Anemia during pregnancy in second trimester 11/25/2023   GDM diagnosed a 12 weeks 10/02/2023   Obesity (BMI 30-39.9) 09/30/2023   Supervision of other normal pregnancy, antepartum 09/23/2023   Condyloma acuminatum in female 01/31/2011    Maternal Fetal medicine Consult  Michele Phillips is a 33 y.o. G2P1001 at [redacted]w[redacted]d here for ultrasound and consultation. Michele Phillips is doing well today with no acute concerns. Today we focused on the following:   Diabetes is well-controlled with diet alone.  Continue growth ultrasounds.  No need for antenatal testing unless medication is required for diabetic control.  The patient had time to ask questions that were answered to her satisfaction.  She verbalized understanding and agrees to proceed with the plan below.  Sonographic findings Single intrauterine pregnancy at 26w 3d.  Fetal cardiac activity:  Observed and appears normal. Presentation: Cephalic. Interval fetal anatomy appears normal. Fetal biometry shows the estimated fetal weight at the 42 percentile. Amniotic fluid volume: Within normal limits. MVP: 8.1 cm. Placenta: Anterior.  There are limitations of prenatal ultrasound such as the inability to detect certain abnormalities due to poor visualization. Various factors such as fetal position, gestational age and maternal body habitus may increase the difficulty in visualizing the fetal anatomy.    Recommendations -Serial growth ultrasounds every 4-6 weeks until delivery -Delivery around 39-[redacted] weeks gestation  Review of Systems: A review of systems was performed and was negative except per HPI   Vitals and Physical Exam    01/20/2024    1:08 PM 12/24/2023    4:23 PM 12/20/2023    8:40 AM  Vitals  with BMI  Height   5' 2  Weight  181 lbs 6 oz 180 lbs 5 oz  BMI  33.17 32.97  Systolic 112 112 868  Diastolic 64 72 69  Pulse 81 84 82    Sitting comfortably on the sonogram table Nonlabored breathing Normal rate and rhythm Abdomen is nontender  Past pregnancies OB History  Gravida Para Term Preterm AB Living  2 1 1  0 0 1  SAB IAB Ectopic Multiple Live Births      1    # Outcome Date GA Lbr Len/2nd Weight Sex Type Anes PTL Lv  2 Current           1 Term 01/15/11 [redacted]w[redacted]d 15:45 / 00:45 6 lb 3.4 oz (2.818 kg) M Vag-Spont EPI  LIV     I spent 20 minutes reviewing the patients chart, including labs and images as well as counseling the patient about her medical conditions. Greater than 50% of the time was spent in direct face-to-face patient counseling.  Delora Smaller  MFM, [place]   01/20/2024  1:54 PM

## 2024-01-20 NOTE — Progress Notes (Signed)
 Tdap vaccine explained and offered to patient. She stated she wants to think about it.   No questions or concerns

## 2024-01-27 DIAGNOSIS — Z3483 Encounter for supervision of other normal pregnancy, third trimester: Secondary | ICD-10-CM | POA: Diagnosis not present

## 2024-01-27 DIAGNOSIS — Z3482 Encounter for supervision of other normal pregnancy, second trimester: Secondary | ICD-10-CM | POA: Diagnosis not present

## 2024-01-28 NOTE — Progress Notes (Signed)
 Opened in error

## 2024-02-04 ENCOUNTER — Encounter: Payer: Self-pay | Admitting: *Deleted

## 2024-02-04 ENCOUNTER — Ambulatory Visit: Payer: Self-pay | Admitting: Dietician

## 2024-02-04 ENCOUNTER — Other Ambulatory Visit: Payer: Self-pay

## 2024-02-04 ENCOUNTER — Ambulatory Visit: Admitting: Obstetrics and Gynecology

## 2024-02-04 VITALS — BP 105/70 | HR 102 | Wt 181.0 lb

## 2024-02-04 DIAGNOSIS — O99012 Anemia complicating pregnancy, second trimester: Secondary | ICD-10-CM | POA: Diagnosis not present

## 2024-02-04 DIAGNOSIS — O99013 Anemia complicating pregnancy, third trimester: Secondary | ICD-10-CM | POA: Diagnosis not present

## 2024-02-04 DIAGNOSIS — O24419 Gestational diabetes mellitus in pregnancy, unspecified control: Secondary | ICD-10-CM

## 2024-02-04 DIAGNOSIS — Z3A28 28 weeks gestation of pregnancy: Secondary | ICD-10-CM | POA: Diagnosis not present

## 2024-02-04 NOTE — Progress Notes (Signed)
   PRENATAL VISIT NOTE  Subjective:  Michele Phillips is a 33 y.o. G2P1001 at [redacted]w[redacted]d being seen today for ongoing prenatal care.  She is currently monitored for the following issues for this high-risk pregnancy and has Condyloma acuminatum in female; Supervision of other normal pregnancy, antepartum; Obesity (BMI 30-39.9); GDM diagnosed a 12 weeks; and Anemia during pregnancy in second trimester on their problem list.  Patient reports no complaints.  Contractions: Not present. Vag. Bleeding: None.  Movement: Present. Denies leaking of fluid.   The following portions of the patient's history were reviewed and updated as appropriate: allergies, current medications, past family history, past medical history, past social history, past surgical history and problem list.   Objective:    Vitals:   02/04/24 1347  BP: 105/70  Pulse: (!) 102  Weight: 181 lb (82.1 kg)    Fetal Status:  Fetal Heart Rate (bpm): 164   Movement: Present    General: Alert, oriented and cooperative. Patient is in no acute distress.  Skin: Skin is warm and dry. No rash noted.   Cardiovascular: Normal heart rate noted  Respiratory: Normal respiratory effort, no problems with respiration noted  Abdomen: Soft, gravid, appropriate for gestational age.  Pain/Pressure: Absent     Pelvic: Cervical exam deferred        Extremities: Normal range of motion.  Edema: None  Mental Status: Normal mood and affect. Normal behavior. Normal judgment and thought content.   Assessment and Plan:  Pregnancy: G2P1001 at [redacted]w[redacted]d 1. [redacted] weeks gestation of pregnancy (Primary) Labs today. Pt desires BTL. D/w her re: this and paperwork signed today. - HIV Antibody (routine testing w rflx) - RPR - CBC - Anemia Profile B  2. Anemia during pregnancy in second trimester - CBC - Anemia Profile B  3. Gestational diabetes mellitus (GDM) in third trimester, gestational diabetes method of control unspecified AM fastings borderline with mostly in  the 90s but a few in the high 90s to low 100s <105 and normal 2h postprandials; pt doesn't snack or drink anything aside from water overnight. Okay to hold off on meds for now  7/30: efw 42%, 946g, ac 53%, afi wnl (borderline mvp of 8.1)    Preterm labor symptoms and general obstetric precautions including but not limited to vaginal bleeding, contractions, leaking of fluid and fetal movement were reviewed in detail with the patient. Please refer to After Visit Summary for other counseling recommendations.   No follow-ups on file.  Future Appointments  Date Time Provider Department Center  02/04/2024  3:15 PM [facility]  02/18/2024  1:15 PM [facility] PROVIDER 1 [facility]  02/18/2024  1:30 PM [facility] US5 [facility]  02/18/2024  2:35 PM Izell Harari, MD [facility]  03/17/2024  1:15 PM [facility] PROVIDER 1 [facility]  03/17/2024  1:30 PM [facility] US5 [facility]  04/14/2024  1:15 PM [facility] PROVIDER 1 [facility]  04/14/2024  1:30 PM [facility] US5 [facility]    Harari Izell, MD

## 2024-02-05 LAB — ANEMIA PROFILE B
Basophils Absolute: 0 x10E3/uL (ref 0.0–0.2)
Basos: 0 %
EOS (ABSOLUTE): 0 x10E3/uL (ref 0.0–0.4)
Eos: 0 %
Ferritin: 67 ng/mL (ref 15–150)
Folate: 11.8 ng/mL (ref >3.0)
Hematocrit: 30.9 % — ABNORMAL LOW (ref 34.0–46.6)
Hemoglobin: 9.8 g/dL — ABNORMAL LOW (ref 11.1–15.9)
Immature Grans (Abs): 0.1 x10E3/uL (ref 0.0–0.1)
Immature Granulocytes: 1 %
Iron Saturation: 14 % — ABNORMAL LOW (ref 15–55)
Iron: 50 ug/dL (ref 27–159)
Lymphocytes Absolute: 1.6 x10E3/uL (ref 0.7–3.1)
Lymphs: 23 %
MCH: 27.6 pg (ref 26.6–33.0)
MCHC: 31.7 g/dL (ref 31.5–35.7)
MCV: 87 fL (ref 79–97)
Monocytes Absolute: 0.4 x10E3/uL (ref 0.1–0.9)
Monocytes: 6 %
Neutrophils Absolute: 4.7 x10E3/uL (ref 1.4–7.0)
Neutrophils: 70 %
Platelets: 294 x10E3/uL (ref 150–450)
RBC: 3.55 x10E6/uL — ABNORMAL LOW (ref 3.77–5.28)
RDW: 13 % (ref 11.7–15.4)
Retic Ct Pct: 2.2 % (ref 0.6–2.6)
Total Iron Binding Capacity: 346 ug/dL (ref 250–450)
UIBC: 296 ug/dL (ref 131–425)
Vitamin B-12: 122 pg/mL — ABNORMAL LOW (ref 232–1245)
WBC: 6.8 x10E3/uL (ref 3.4–10.8)

## 2024-02-05 LAB — HIV ANTIBODY (ROUTINE TESTING W REFLEX): HIV Screen 4th Generation wRfx: NONREACTIVE

## 2024-02-05 LAB — RPR: RPR Ser Ql: NONREACTIVE

## 2024-02-08 ENCOUNTER — Ambulatory Visit: Payer: Self-pay | Admitting: Obstetrics and Gynecology

## 2024-02-08 DIAGNOSIS — O280 Abnormal hematological finding on antenatal screening of mother: Secondary | ICD-10-CM | POA: Insufficient documentation

## 2024-02-08 MED ORDER — VITAMIN B-12 1000 MCG PO TABS: 1000.0000 ug | ORAL_TABLET | Freq: Every day | ORAL | 0 refills | Status: AC

## 2024-02-08 MED ORDER — FERROUS SULFATE 324 MG PO TBEC: 324.0000 mg | DELAYED_RELEASE_TABLET | ORAL | 0 refills | Status: AC

## 2024-02-18 ENCOUNTER — Ambulatory Visit: Admitting: Obstetrics and Gynecology

## 2024-02-18 ENCOUNTER — Ambulatory Visit: Attending: Maternal & Fetal Medicine

## 2024-02-18 ENCOUNTER — Other Ambulatory Visit: Payer: Self-pay

## 2024-02-18 VITALS — BP 123/66 | HR 88 | Wt 180.0 lb

## 2024-02-18 VITALS — BP 115/65 | HR 81

## 2024-02-18 DIAGNOSIS — O99013 Anemia complicating pregnancy, third trimester: Secondary | ICD-10-CM

## 2024-02-18 DIAGNOSIS — O24410 Gestational diabetes mellitus in pregnancy, diet controlled: Secondary | ICD-10-CM | POA: Insufficient documentation

## 2024-02-18 DIAGNOSIS — O24419 Gestational diabetes mellitus in pregnancy, unspecified control: Secondary | ICD-10-CM | POA: Insufficient documentation

## 2024-02-18 DIAGNOSIS — O99213 Obesity complicating pregnancy, third trimester: Secondary | ICD-10-CM | POA: Diagnosis not present

## 2024-02-18 DIAGNOSIS — Z3A30 30 weeks gestation of pregnancy: Secondary | ICD-10-CM | POA: Insufficient documentation

## 2024-02-18 DIAGNOSIS — E669 Obesity, unspecified: Secondary | ICD-10-CM | POA: Diagnosis not present

## 2024-02-18 DIAGNOSIS — Z3480 Encounter for supervision of other normal pregnancy, unspecified trimester: Secondary | ICD-10-CM | POA: Diagnosis present

## 2024-02-18 DIAGNOSIS — O280 Abnormal hematological finding on antenatal screening of mother: Secondary | ICD-10-CM

## 2024-02-18 DIAGNOSIS — Z362 Encounter for other antenatal screening follow-up: Secondary | ICD-10-CM | POA: Diagnosis not present

## 2024-02-18 DIAGNOSIS — D649 Anemia, unspecified: Secondary | ICD-10-CM

## 2024-02-18 DIAGNOSIS — O99012 Anemia complicating pregnancy, second trimester: Secondary | ICD-10-CM

## 2024-02-18 NOTE — Progress Notes (Signed)
 PRENATAL VISIT NOTE  Subjective:  Michele Phillips is a 33 y.o. G2P1001 at [redacted]w[redacted]d being seen today for ongoing prenatal care.  She is currently monitored for the following issues for this high-risk pregnancy and has Condyloma acuminatum in female; Supervision of other normal pregnancy, antepartum; Obesity (BMI 30-39.9); GDM diagnosed a 12 weeks; Anemia during pregnancy in second trimester; and Low maternal serum vitamin B12 on their problem list.  Patient reports no complaints.  Contractions: Not present. Vag. Bleeding: None.  Movement: Present. Denies leaking of fluid.   The following portions of the patient's history were reviewed and updated as appropriate: allergies, current medications, past family history, past medical history, past social history, past surgical history and problem list.   Objective:    Vitals:   02/18/24 1429  BP: 123/66  Pulse: 88  Weight: 180 lb (81.6 kg)    Fetal Status:  Fetal Heart Rate (bpm): 141   Movement: Present    General: Alert, oriented and cooperative. Patient is in no acute distress.  Skin: Skin is warm and dry. No rash noted.   Cardiovascular: Normal heart rate noted  Respiratory: Normal respiratory effort, no problems with respiration noted  Abdomen: Soft, gravid, appropriate for gestational age.  Pain/Pressure: Absent     Pelvic: Cervical exam deferred        Extremities: Normal range of motion.  Edema: None  Mental Status: Normal mood and affect. Normal behavior. Normal judgment and thought content.   Assessment and Plan:  Pregnancy: G2P1001 at [redacted]w[redacted]d 1. [redacted] weeks gestation of pregnancy (Primary) BTL signed 8/14.   2. Anemia during pregnancy in second trimester 8/14 anemia profile with normal iron and ferritin (67) with hgb 9.8. recommend re-check next visit. Continue b12 for low b12 at that visit.   3. Gestational diabetes mellitus (GDM) in third trimester, gestational diabetes method of control unspecified AM fastings still  borderline but better than last visit: This morning 96/98/94/99/101/94/97/98 2h PP 102-119 values Patient had growth u/s and report not in but it looks like efw 48%, 1655g, ac 68%, afi 15.5 vs 7/30: efw 42%, 946g, ac 53%, afi wnl (borderline mvp of 8.1)  I told that her her AM fastings are still borderline but b/c efw, ac, afi and are still normal, I recommend holding off on meds for now since medicines can have side effects as well; I told her that I'd recommend metformin if we did medicines now. I also told her that she may need it at some point and to let us  know if her sugars are going up before her next visit. She heard that she may need weekly NSTs but I told her that is only if she needs medicines so doesn't need it right now. I also d/w her re: potential delivery timing at 37-39wks based on sugars, if she needs medications, what the u/s are showing, etc.   Preterm labor symptoms and general obstetric precautions including but not limited to vaginal bleeding, contractions, leaking of fluid and fetal movement were reviewed in detail with the patient. Please refer to After Visit Summary for other counseling recommendations.   No follow-ups on file.  Future Appointments  Date Time Provider Department Center  03/03/2024  1:55 PM Eveline Lynwood MATSU, MD [facility]  03/17/2024  1:15 PM [facility] PROVIDER 1 [facility]  03/17/2024  1:30 PM [facility] US5 [facility]  03/17/2024  4:15 PM Claudene Leas , CNM [facility]  03/29/2024  1:55 PM Nicholaus Burnard HERO, MD [facility]  04/05/2024 11:15 AM Nicholaus Jorene FORBES DEVONNA [facility]  04/14/2024  1:15 PM [facility] PROVIDER 1 [facility]  04/14/2024  1:30 PM [facility] US5 [facility]  04/14/2024  3:15 PM Eveline Lynwood MATSU, MD [facility]  04/21/2024  2:35 PM Eveline Lynwood MATSU, MD [facility]  04/28/2024  3:55 PM [facility] 2 [facility]    Bebe Furry, MD

## 2024-02-18 NOTE — Progress Notes (Signed)
 Maternal-Fetal Medicine Consultation Name: Michele Phillips MRN: [id number]  G2 P1001 at 30w 4d gestation.  Patient is here for fetal growth assessment. Diet-controlled gestational diabetes gestational diabetes. Blood pressure today at our office is 115/65 mmHg.  Obstetrical history significant for term vaginal delivery of a female infant weighing 6 pounds and 3 ounces at birth.  Ultrasound Fetal growth is appropriate for gestational age.  Amniotic fluid normal good fetal activity seen.  Cephalic presentation.  Gestational diabetes I reviewed her blood glucose log.  Fasting levels are higher up to 201 mg/dL and postprandial levels are within normal range.  Patient had prenatal visit today. I counseled the patient that the fasting levels do not improve, oral hypoglycemics (metformin) or insulin should be considered.  I reassured the patient that metformin is safe in pregnancy.  Side effects include abdominal bloating. I discussed the importance of good blood glucose control to prevent adverse outcomes. Patient was counseled that she will require weekly antenatal testing from her next visit if she takes metformin or insulin.  Patient will contact her provider if fasting hyperglycemia persists.  Recommendations - An appointment was made for her to return in 4 weeks for fetal growth assessment. - Weekly antenatal testing from next visit if patient requires metformin or insulin.   Consultation including face-to-face (more than 50%) counseling 20 minutes.

## 2024-03-03 ENCOUNTER — Ambulatory Visit (INDEPENDENT_AMBULATORY_CARE_PROVIDER_SITE_OTHER)

## 2024-03-03 ENCOUNTER — Other Ambulatory Visit: Payer: Self-pay

## 2024-03-03 VITALS — BP 116/75 | HR 92 | Wt 184.0 lb

## 2024-03-03 DIAGNOSIS — O99012 Anemia complicating pregnancy, second trimester: Secondary | ICD-10-CM

## 2024-03-03 DIAGNOSIS — O24410 Gestational diabetes mellitus in pregnancy, diet controlled: Secondary | ICD-10-CM | POA: Diagnosis not present

## 2024-03-03 DIAGNOSIS — O280 Abnormal hematological finding on antenatal screening of mother: Secondary | ICD-10-CM | POA: Diagnosis not present

## 2024-03-03 DIAGNOSIS — Z3A32 32 weeks gestation of pregnancy: Secondary | ICD-10-CM

## 2024-03-03 DIAGNOSIS — O99013 Anemia complicating pregnancy, third trimester: Secondary | ICD-10-CM

## 2024-03-03 NOTE — Progress Notes (Cosign Needed Addendum)
   Subjective:  Michele Phillips is a 33 y.o. G2P1001 at [redacted]w[redacted]d being seen today for ongoing prenatal care.  She is currently monitored for the following issues for this high-risk pregnancy and has Condyloma acuminatum in female; Supervision of other normal pregnancy, antepartum; Obesity (BMI 30-39.9); GDM diagnosed a 12 weeks; Anemia during pregnancy in second trimester; and Low maternal serum vitamin B12 on their problem list.  Patient reports no complaints.  Contractions: Not present. Vag. Bleeding: None.  Movement: Present. Denies leaking of fluid.   The following portions of the patient's history were reviewed and updated as appropriate: allergies, current medications, past family history, past medical history, past social history, past surgical history and problem list. Problem list updated.  Objective:   Vitals:   03/03/24 1416  BP: 116/75  Pulse: 92  Weight: 83.5 kg    Fetal Status: Fetal Heart Rate (bpm): 158   Movement: Present  Fundal Ht: 32cm   General:  Alert, oriented and cooperative. Patient is in no acute distress.  Skin: Skin is warm and dry. No rash noted.   Cardiovascular: Normal heart rate noted  Respiratory: Normal respiratory effort, no problems with respiration noted  Abdomen: Soft, gravid, appropriate for gestational age. Pain/Pressure: Absent     Pelvic: Vag. Bleeding: None     Cervical exam deferred        Extremities: Normal range of motion.  Edema: None  Mental Status: Normal mood and affect. Normal behavior. Normal judgment and thought content.   Urinalysis:      Assessment and Plan:  Pregnancy: G2P1001 at [redacted]w[redacted]d  1. Diet controlled gestational diabetes mellitus (GDM) in third trimester (Primary) Reports BG similar to last visit  2. [redacted] weeks gestation of pregnancy Patient had growth u/s 8/26 efw 48%, 1655g, ac 68%, afi 15.5 BTL signed 8/14  3. Anemia during pregnancy in second trimester On Ferrous & B12 - CBC With Diff/Platelet  4. Low maternal  serum vitamin B12 On ferrous & B12 - CBC With Diff/Platelet - Vitamin B12  Preterm labor symptoms and general obstetric precautions including but not limited to vaginal bleeding, contractions, leaking of fluid and fetal movement were reviewed in detail with the patient. Please refer to After Visit Summary for other counseling recommendations.  No follow-ups on file.   Trudy Leeroy NOVAK, MD

## 2024-03-04 LAB — CBC WITH DIFF/PLATELET
Basophils Absolute: 0 x10E3/uL (ref 0.0–0.2)
Basos: 0 %
EOS (ABSOLUTE): 0 x10E3/uL (ref 0.0–0.4)
Eos: 0 %
Hematocrit: 31.4 % — ABNORMAL LOW (ref 34.0–46.6)
Hemoglobin: 9.9 g/dL — ABNORMAL LOW (ref 11.1–15.9)
Immature Grans (Abs): 0 x10E3/uL (ref 0.0–0.1)
Immature Granulocytes: 0 %
Lymphocytes Absolute: 2 x10E3/uL (ref 0.7–3.1)
Lymphs: 25 %
MCH: 27.6 pg (ref 26.6–33.0)
MCHC: 31.5 g/dL (ref 31.5–35.7)
MCV: 88 fL (ref 79–97)
Monocytes Absolute: 0.5 x10E3/uL (ref 0.1–0.9)
Monocytes: 6 %
Neutrophils Absolute: 5.5 x10E3/uL (ref 1.4–7.0)
Neutrophils: 68 %
Platelets: 263 x10E3/uL (ref 150–450)
RBC: 3.59 x10E6/uL — ABNORMAL LOW (ref 3.77–5.28)
RDW: 13.2 % (ref 11.7–15.4)
WBC: 8.1 x10E3/uL (ref 3.4–10.8)

## 2024-03-07 ENCOUNTER — Ambulatory Visit: Payer: Self-pay

## 2024-03-16 ENCOUNTER — Encounter: Admitting: Obstetrics and Gynecology

## 2024-03-17 ENCOUNTER — Ambulatory Visit (HOSPITAL_BASED_OUTPATIENT_CLINIC_OR_DEPARTMENT_OTHER): Admitting: Obstetrics and Gynecology

## 2024-03-17 ENCOUNTER — Other Ambulatory Visit: Payer: Self-pay | Admitting: *Deleted

## 2024-03-17 ENCOUNTER — Other Ambulatory Visit: Payer: Self-pay | Admitting: Maternal & Fetal Medicine

## 2024-03-17 ENCOUNTER — Ambulatory Visit: Attending: Maternal & Fetal Medicine

## 2024-03-17 ENCOUNTER — Ambulatory Visit: Admitting: Advanced Practice Midwife

## 2024-03-17 VITALS — BP 111/61 | HR 108

## 2024-03-17 VITALS — BP 117/75 | HR 80 | Wt 182.7 lb

## 2024-03-17 DIAGNOSIS — E669 Obesity, unspecified: Secondary | ICD-10-CM

## 2024-03-17 DIAGNOSIS — O99213 Obesity complicating pregnancy, third trimester: Secondary | ICD-10-CM | POA: Diagnosis not present

## 2024-03-17 DIAGNOSIS — O24419 Gestational diabetes mellitus in pregnancy, unspecified control: Secondary | ICD-10-CM | POA: Diagnosis not present

## 2024-03-17 DIAGNOSIS — O24410 Gestational diabetes mellitus in pregnancy, diet controlled: Secondary | ICD-10-CM | POA: Diagnosis not present

## 2024-03-17 DIAGNOSIS — Z3A34 34 weeks gestation of pregnancy: Secondary | ICD-10-CM

## 2024-03-17 DIAGNOSIS — O24415 Gestational diabetes mellitus in pregnancy, controlled by oral hypoglycemic drugs: Secondary | ICD-10-CM

## 2024-03-17 DIAGNOSIS — Z3480 Encounter for supervision of other normal pregnancy, unspecified trimester: Secondary | ICD-10-CM

## 2024-03-17 MED ORDER — METFORMIN HCL 500 MG PO TABS: 500.0000 mg | ORAL_TABLET | Freq: Every day | ORAL | 1 refills | Status: AC

## 2024-03-17 NOTE — Progress Notes (Signed)
 Maternal-Fetal Medicine Consultation  Name: Michele Phillips  MRN: [id number]  GA: [id number] [redacted]w[redacted]d   Gestational diabetes.  Patient is trying to control her blood glucose with dietary management.  She reports her fasting levels range between 90 and 114 mg/dL and postprandial levels are normal (less than 120 mg/dL). Blood pressure today at our office 111/61 mmHg.  Ultrasound Fetal growth is appropriate for gestational age.  Amniotic fluid normal good fetal activity seen.  Cephalic presentation.  Antenatal testing is reassuring.  BPP 8/8.  I discussed normal blood glucose parameters and that her fasting levels are high.  I discussed the option of oral hypoglycemics or insulin.  I counseled the patient on metformin  and its side effects (abdominal bloating and nausea).  I recommended metformin .  I discussed the importance of weekly antenatal testing till delivery.  I counseled the patient on the components of biophysical profile or nonstress test that may be performed.  Timing of delivery: And well-controlled diabetes, patient can be delivered at 39 weeks' gestation.  If diabetes is not well-controlled, delivery should be considered at 44- or 38-weeks' gestation.  Recommendations - Continue weekly antenatal testing till delivery. - Consider initiating metformin .     Consultation including face-to-face (more than 50%) counseling 20 minutes.

## 2024-03-17 NOTE — Patient Instructions (Signed)
 TDaP Vaccine Pregnancy Get the Whooping Cough Vaccine While You Are Pregnant (CDC)  It is important for women to get the whooping cough vaccine in the third trimester of each pregnancy. Vaccines are the best way to prevent this disease. There are 2 different whooping cough vaccines. Both vaccines combine protection against whooping cough, tetanus and diphtheria, but they are for different age groups: Tdap: for everyone 11 years or older, including pregnant women  DTaP: for children 2 months through 10 years of age  You need the whooping cough vaccine during each of your pregnancies The recommended time to get the shot is during your 27th through 36th week of pregnancy, preferably during the earlier part of this time period. The Centers for Disease Control and Prevention (CDC) recommends that pregnant women receive the whooping cough vaccine for adolescents and adults (called Tdap vaccine) during the third trimester of each pregnancy. The recommended time to get the shot is during your 27th through 36th week of pregnancy, preferably during the earlier part of this time period. This replaces the original recommendation that pregnant women get the vaccine only if they had not previously received it. The [employer] of Obstetricians and Gynecologists and the [employer] support this recommendation.  You should get the whooping cough vaccine while pregnant to pass protection to your baby frame support disabled and/or not supported in this browser  Learn why Michele Phillips decided to get the whooping cough vaccine in her 3rd trimester of pregnancy and how her baby girl was born with some protection against the disease. Also available on YouTube. After receiving the whooping cough vaccine, your body will create protective antibodies (proteins produced by the body to fight off diseases) and pass some of them to your baby before birth. These antibodies provide your baby some short-term  protection against whooping cough in early life. These antibodies can also protect your baby from some of the more serious complications that come along with whooping cough. Your protective antibodies are at their highest about 2 weeks after getting the vaccine, but it takes time to pass them to your baby. So the preferred time to get the whooping cough vaccine is early in your third trimester. The amount of whooping cough antibodies in your body decreases over time. That is why CDC recommends you get a whooping cough vaccine during each pregnancy. Doing so allows each of your babies to get the greatest number of protective antibodies from you. This means each of your babies will get the best protection possible against this disease.  Getting the whooping cough vaccine while pregnant is better than getting the vaccine after you give birth Whooping cough vaccination during pregnancy is ideal so your baby will have short-term protection as soon as he is born. This early protection is important because your baby will not start getting his whooping cough vaccines until he is 2 months old. These first few months of life are when your baby is at greatest risk for catching whooping cough. This is also when he's at greatest risk for having severe, potentially life-threating complications from the infection. To avoid that gap in protection, it is best to get a whooping cough vaccine during pregnancy. You will then pass protection to your baby before he is born. To continue protecting your baby, he should get whooping cough vaccines starting at 2 months old. You may never have gotten the Tdap vaccine before and did not get it during this pregnancy. If so, you should make sure  to get the vaccine immediately after you give birth, before leaving the hospital or birthing center. It will take about 2 weeks before your body develops protection (antibodies) in response to the vaccine. Once you have protection from the vaccine,  you are less likely to give whooping cough to your newborn while caring for him. But remember, your baby will still be at risk for catching whooping cough from others. A recent study looked to see how effective Tdap was at preventing whooping cough in babies whose mothers got the vaccine while pregnant or in the hospital after giving birth. The study found that getting Tdap between 27 through 36 weeks of pregnancy is 85% more effective at preventing whooping cough in babies younger than 2 months old. Blood tests cannot tell if you need a whooping cough vaccine There are no blood tests that can tell you if you have enough antibodies in your body to protect yourself or your baby against whooping cough. Even if you have been sick with whooping cough in the past or previously received the vaccine, you still should get the vaccine during each pregnancy. Breastfeeding may pass some protective antibodies onto your baby By breastfeeding, you may pass some antibodies you have made in response to the vaccine to your baby. When you get a whooping cough vaccine during your pregnancy, you will have antibodies in your breast milk that you can share with your baby as soon as your milk comes in. However, your baby will not get protective antibodies immediately if you wait to get the whooping cough vaccine until after delivering your baby. This is because it takes about 2 weeks for your body to create antibodies. Learn more about the health benefits of breastfeeding.

## 2024-03-17 NOTE — Progress Notes (Signed)
   PRENATAL VISIT NOTE  Subjective:  Michele Phillips is a 33 y.o. G2P1001 at [redacted]w[redacted]d being seen today for ongoing prenatal care.  She is currently monitored for the following issues for this high-risk pregnancy and has Condyloma acuminatum in female; Supervision of other normal pregnancy, antepartum; Obesity (BMI 30-39.9); Oral hypoglycemic controlled White classification A2 gestational diabetes mellitus (GDM); Anemia during pregnancy in second trimester; and Low maternal serum vitamin B12 on their problem list.   Patient reports no complaints.  Contractions: Not present. Vag. Bleeding: None.  Movement: Present. Denies leaking of fluid.   The following portions of the patient's history were reviewed and updated as appropriate: allergies, current medications, past family history, past medical history, past social history, past surgical history and problem list.   Objective:   Vitals:   03/17/24 1541  BP: 117/75  Pulse: 80  Weight: 182 lb 11.2 oz (82.9 kg)    Fetal Status: Fetal Heart Rate (bpm): 154   Movement: Present     General:  Alert, oriented and cooperative. Patient is in no acute distress.  Skin: Skin is warm and dry. No rash noted.   Cardiovascular: Normal heart rate noted  Respiratory: Normal respiratory effort, no problems with respiration noted  Abdomen: Soft, gravid, appropriate for gestational age.  Pain/Pressure: Absent     Pelvic: Cervical exam deferred        Extremities: Normal range of motion.  Edema: None  Mental Status: Normal mood and affect. Normal behavior. Normal judgment and thought content.    US  03/17/24: Est. FW: 2547 gm 5 lb 10 oz 56 % , Nml fluid  Assessment and Plan:  Pregnancy: G2P1001 at [redacted]w[redacted]d 1. Oral hypoglycemic controlled White classification A2 gestational diabetes mellitus (GDM) (Primary) - metFORMIN  (GLUCOPHAGE ) 500 MG tablet; Take 1 tablet (500 mg total) by mouth at bedtime.  Dispense: 30 tablet; Refill: 1 - > 1/2 of fasting CBGs elevated  despite lifestyle changes. Discussed that insulin is first line. Pt still prefers PO meds. Start Metformin . - Nml growth and fluid - Antenatal testing per MFM   2. Supervision of other normal pregnancy, antepartum  3. [redacted] weeks gestation of pregnancy  Preterm labor symptoms and general obstetric precautions including but not limited to vaginal bleeding, contractions, leaking of fluid and fetal movement were reviewed in detail with the patient. Please refer to After Visit Summary for other counseling recommendations.   Return for [facility] AS SCHEDULED.  Future Appointments  Date Time Provider Department Center  03/25/2024  1:15 PM [facility] NST [facility]  03/29/2024  1:55 PM Nicholaus Burnard HERO, MD [facility]  04/01/2024  3:15 PM [facility] NST [facility]  04/05/2024  3:15 PM Izell Harari, MD [facility]  04/08/2024  3:15 PM [facility] NST [facility]  04/14/2024  1:15 PM [facility] PROVIDER 1 [facility]  04/14/2024  1:30 PM [facility] US5 [facility]  04/14/2024  3:15 PM Lola Donnice HERO, MD [facility]  04/21/2024  2:35 PM Eveline Lynwood MATSU, MD [facility]  04/28/2024  3:55 PM Claudene, Lela Murfin , CNM [facility]    Mohanad Carsten  Claudene HOWARD [facility]

## 2024-03-25 ENCOUNTER — Ambulatory Visit: Attending: Obstetrics and Gynecology | Admitting: *Deleted

## 2024-03-25 VITALS — BP 119/64 | HR 79

## 2024-03-25 DIAGNOSIS — O24415 Gestational diabetes mellitus in pregnancy, controlled by oral hypoglycemic drugs: Secondary | ICD-10-CM | POA: Insufficient documentation

## 2024-03-25 DIAGNOSIS — Z3A35 35 weeks gestation of pregnancy: Secondary | ICD-10-CM | POA: Insufficient documentation

## 2024-03-25 NOTE — Procedures (Signed)
 Michele Phillips 1991-05-07 [redacted]w[redacted]d  Fetus A Non-Stress Test Interpretation for 03/25/24- NST only  Indication: Gestational Diabetes medication controlled  Fetal Heart Rate A Mode: External Baseline Rate (A): 140 bpm Variability: Moderate Accelerations: 15 x 15 Decelerations: None Multiple birth?: No  Uterine Activity Mode: Toco Contraction Frequency (min): occas Contraction Duration (sec): 90 Contraction Quality: Mild Resting Tone Palpated: Relaxed  Interpretation (Fetal Testing) Nonstress Test Interpretation: Reactive Comments: Tracing reviewed byDr. Arna

## 2024-03-29 ENCOUNTER — Encounter: Payer: Self-pay | Admitting: Obstetrics and Gynecology

## 2024-03-29 ENCOUNTER — Other Ambulatory Visit (HOSPITAL_COMMUNITY)
Admission: RE | Admit: 2024-03-29 | Discharge: 2024-03-29 | Disposition: A | Discharge status: Home / Self Care | Source: Ambulatory Visit | Attending: Obstetrics and Gynecology | Admitting: Obstetrics and Gynecology

## 2024-03-29 ENCOUNTER — Ambulatory Visit (INDEPENDENT_AMBULATORY_CARE_PROVIDER_SITE_OTHER): Admitting: Obstetrics and Gynecology

## 2024-03-29 ENCOUNTER — Other Ambulatory Visit: Payer: Self-pay

## 2024-03-29 VITALS — BP 125/80 | HR 96 | Wt 183.7 lb

## 2024-03-29 DIAGNOSIS — O24415 Gestational diabetes mellitus in pregnancy, controlled by oral hypoglycemic drugs: Secondary | ICD-10-CM | POA: Diagnosis not present

## 2024-03-29 DIAGNOSIS — Z3480 Encounter for supervision of other normal pregnancy, unspecified trimester: Secondary | ICD-10-CM | POA: Insufficient documentation

## 2024-03-29 DIAGNOSIS — O99013 Anemia complicating pregnancy, third trimester: Secondary | ICD-10-CM | POA: Diagnosis not present

## 2024-03-29 DIAGNOSIS — Z3A36 36 weeks gestation of pregnancy: Secondary | ICD-10-CM | POA: Diagnosis not present

## 2024-03-29 DIAGNOSIS — O99012 Anemia complicating pregnancy, second trimester: Secondary | ICD-10-CM

## 2024-03-29 DIAGNOSIS — E669 Obesity, unspecified: Secondary | ICD-10-CM

## 2024-03-29 NOTE — Progress Notes (Signed)
   PRENATAL VISIT NOTE  Subjective:  Michele Phillips is a 33 y.o. G2P1001 at [redacted]w[redacted]d being seen today for ongoing prenatal care.  She is currently monitored for the following issues for this high-risk pregnancy and has Condyloma acuminatum in female; Supervision of other normal pregnancy, antepartum; Obesity (BMI 30-39.9); Oral hypoglycemic controlled White classification A2 gestational diabetes mellitus (GDM); Anemia during pregnancy in second trimester; and Low maternal serum vitamin B12 on their problem list.  Patient reports increased pressure, some contractions.  Contractions: Irritability. Vag. Bleeding: None.  Movement: Present. Denies leaking of fluid.   The following portions of the patient's history were reviewed and updated as appropriate: allergies, current medications, past family history, past medical history, past social history, past surgical history and problem list.   Objective:    Vitals:   03/29/24 1405  BP: 125/80  Pulse: 96  Weight: 183 lb 11.2 oz (83.3 kg)    Fetal Status:  Fetal Heart Rate (bpm): 150   Movement: Present    General: Alert, oriented and cooperative. Patient is in no acute distress.  Skin: Skin is warm and dry. No rash noted.   Cardiovascular: Normal heart rate noted  Respiratory: Normal respiratory effort, no problems with respiration noted  Abdomen: Soft, gravid, appropriate for gestational age.  Pain/Pressure: Present     Pelvic: Cervical exam performed in the presence of a chaperone Dilation: Fingertip      Extremities: Normal range of motion.  Edema: None  Mental Status: Normal mood and affect. Normal behavior. Normal judgment and thought content.   Assessment and Plan:  Pregnancy: G2P1001 at [redacted]w[redacted]d  1. Supervision of other normal pregnancy, antepartum (Primary)  2. Oral hypoglycemic controlled White classification A2 gestational diabetes mellitus (GDM) Was prescribed metformin  500 mg at bedtime, but has not been taking Fasting this am was  102, pp were 99 and 118 Given uncontrolled gDM at this point, recommend induction at 37 weeks, reviewed risks/benefits, patient is agreeable IOL for 37 weeks scheduled Keep MFM appt for this week  3. Obesity (BMI 30-39.9) Cont   4. Anemia during pregnancy in second trimester   Preterm labor symptoms and general obstetric precautions including but not limited to vaginal bleeding, contractions, leaking of fluid and fetal movement were reviewed in detail with the patient. Please refer to After Visit Summary for other counseling recommendations.   Return in about 5 weeks (around 05/03/2024) for post partum check.  Future Appointments  Date Time Provider Department Center  04/01/2024  3:15 PM [facility] NST [facility]  04/05/2024  3:15 PM Izell Harari, MD [facility]  04/08/2024  3:15 PM [facility] NST [facility]  04/14/2024  1:15 PM [facility] PROVIDER 1 [facility]  04/14/2024  1:30 PM [facility] US5 [facility]  04/14/2024  3:15 PM Lola Donnice HERO, MD [facility]  04/21/2024  2:35 PM Eveline Lynwood MATSU, MD [facility]  04/28/2024  3:55 PM Claudene, [place] , CNM [facility]    Burnard HERO Moats, MD

## 2024-03-30 LAB — CERVICOVAGINAL ANCILLARY ONLY
Chlamydia: NEGATIVE
Comment: NEGATIVE
Comment: NORMAL
Neisseria Gonorrhea: NEGATIVE

## 2024-03-31 ENCOUNTER — Encounter (HOSPITAL_COMMUNITY): Payer: Self-pay | Admitting: *Deleted

## 2024-03-31 ENCOUNTER — Telehealth (HOSPITAL_COMMUNITY): Payer: Self-pay | Admitting: *Deleted

## 2024-03-31 NOTE — Telephone Encounter (Signed)
 Preadmission screen

## 2024-04-01 ENCOUNTER — Ambulatory Visit: Attending: Obstetrics and Gynecology | Admitting: *Deleted

## 2024-04-01 ENCOUNTER — Ambulatory Visit: Payer: Self-pay | Admitting: Obstetrics and Gynecology

## 2024-04-01 DIAGNOSIS — O99213 Obesity complicating pregnancy, third trimester: Secondary | ICD-10-CM | POA: Insufficient documentation

## 2024-04-01 DIAGNOSIS — O24415 Gestational diabetes mellitus in pregnancy, controlled by oral hypoglycemic drugs: Secondary | ICD-10-CM | POA: Diagnosis not present

## 2024-04-01 DIAGNOSIS — E669 Obesity, unspecified: Secondary | ICD-10-CM

## 2024-04-01 DIAGNOSIS — Z3A36 36 weeks gestation of pregnancy: Secondary | ICD-10-CM | POA: Insufficient documentation

## 2024-04-01 DIAGNOSIS — B951 Streptococcus, group B, as the cause of diseases classified elsewhere: Secondary | ICD-10-CM | POA: Insufficient documentation

## 2024-04-01 LAB — CULTURE, BETA STREP (GROUP B ONLY): Strep Gp B Culture: POSITIVE — AB

## 2024-04-01 NOTE — Procedures (Signed)
 Michele Phillips 1991-03-18 [redacted]w[redacted]d  Fetus A Non-Stress Test Interpretation for 04/01/24  Indication: Gestational Diabetes medication controlled and Obesity  Fetal Heart Rate A Mode: External Baseline Rate (A): 135 bpm Variability: Moderate Accelerations: 15 x 15 Decelerations: None Multiple birth?: No  Uterine Activity Mode: Toco Contraction Frequency (min): 3-4 Contraction Duration (sec): 30-120 Contraction Quality: Mild (Pt states UC's are not painful.) Resting Tone Palpated: Relaxed Resting Time: Adequate  Interpretation (Fetal Testing) Nonstress Test Interpretation: Reactive Comments: Tracing reviewed by Dr. William.  IOL scheduled on 10/13

## 2024-04-04 ENCOUNTER — Encounter (HOSPITAL_COMMUNITY): Payer: Self-pay | Admitting: Obstetrics & Gynecology

## 2024-04-04 ENCOUNTER — Inpatient Hospital Stay (HOSPITAL_COMMUNITY): Admitting: Anesthesiology

## 2024-04-04 ENCOUNTER — Inpatient Hospital Stay (HOSPITAL_COMMUNITY)
Admission: RE | Admit: 2024-04-04 | Discharge: 2024-04-06 | DRG: 798 | Disposition: A | Discharge status: Home / Self Care | Attending: Family Medicine | Admitting: Family Medicine

## 2024-04-04 ENCOUNTER — Other Ambulatory Visit: Payer: Self-pay

## 2024-04-04 ENCOUNTER — Inpatient Hospital Stay (HOSPITAL_COMMUNITY)

## 2024-04-04 DIAGNOSIS — O99214 Obesity complicating childbirth: Secondary | ICD-10-CM | POA: Diagnosis present

## 2024-04-04 DIAGNOSIS — O99824 Streptococcus B carrier state complicating childbirth: Secondary | ICD-10-CM | POA: Diagnosis not present

## 2024-04-04 DIAGNOSIS — O24420 Gestational diabetes mellitus in childbirth, diet controlled: Secondary | ICD-10-CM | POA: Diagnosis not present

## 2024-04-04 DIAGNOSIS — Z302 Encounter for sterilization: Secondary | ICD-10-CM

## 2024-04-04 DIAGNOSIS — Z833 Family history of diabetes mellitus: Secondary | ICD-10-CM | POA: Diagnosis not present

## 2024-04-04 DIAGNOSIS — O26893 Other specified pregnancy related conditions, third trimester: Secondary | ICD-10-CM | POA: Diagnosis not present

## 2024-04-04 DIAGNOSIS — O24425 Gestational diabetes mellitus in childbirth, controlled by oral hypoglycemic drugs: Secondary | ICD-10-CM | POA: Diagnosis not present

## 2024-04-04 DIAGNOSIS — O326XX0 Maternal care for compound presentation, not applicable or unspecified: Secondary | ICD-10-CM | POA: Diagnosis present

## 2024-04-04 DIAGNOSIS — Z8249 Family history of ischemic heart disease and other diseases of the circulatory system: Secondary | ICD-10-CM | POA: Diagnosis not present

## 2024-04-04 DIAGNOSIS — Z3490 Encounter for supervision of normal pregnancy, unspecified, unspecified trimester: Secondary | ICD-10-CM

## 2024-04-04 DIAGNOSIS — O99820 Streptococcus B carrier state complicating pregnancy: Secondary | ICD-10-CM | POA: Diagnosis not present

## 2024-04-04 DIAGNOSIS — O134 Gestational [pregnancy-induced] hypertension without significant proteinuria, complicating childbirth: Principal | ICD-10-CM | POA: Diagnosis present

## 2024-04-04 DIAGNOSIS — Z3A37 37 weeks gestation of pregnancy: Secondary | ICD-10-CM

## 2024-04-04 DIAGNOSIS — O9902 Anemia complicating childbirth: Secondary | ICD-10-CM | POA: Diagnosis present

## 2024-04-04 DIAGNOSIS — O2492 Unspecified diabetes mellitus in childbirth: Secondary | ICD-10-CM | POA: Diagnosis not present

## 2024-04-04 HISTORY — DX: Encounter for supervision of normal pregnancy, unspecified, unspecified trimester: Z34.90

## 2024-04-04 LAB — COMPREHENSIVE METABOLIC PANEL WITH GFR
ALT: 8 U/L (ref 0–44)
AST: 14 U/L — ABNORMAL LOW (ref 15–41)
Albumin: 2.6 g/dL — ABNORMAL LOW (ref 3.5–5.0)
Alkaline Phosphatase: 93 U/L (ref 38–126)
Anion gap: 10 (ref 5–15)
BUN: 5 mg/dL — ABNORMAL LOW (ref 6–20)
CO2: 20 mmol/L — ABNORMAL LOW (ref 22–32)
Calcium: 9.1 mg/dL (ref 8.9–10.3)
Chloride: 106 mmol/L (ref 98–111)
Creatinine, Ser: 0.64 mg/dL (ref 0.44–1.00)
GFR, Estimated: >60 mL/min (ref >60)
Glucose, Bld: 104 mg/dL — ABNORMAL HIGH (ref 70–99)
Potassium: 3.4 mmol/L — ABNORMAL LOW (ref 3.5–5.1)
Sodium: 136 mmol/L (ref 135–145)
Total Bilirubin: 0.7 mg/dL (ref 0.0–1.2)
Total Protein: 5.9 g/dL — ABNORMAL LOW (ref 6.5–8.1)

## 2024-04-04 LAB — CBC
HCT: 30.7 % — ABNORMAL LOW (ref 36.0–46.0)
Hemoglobin: 10.3 g/dL — ABNORMAL LOW (ref 12.0–15.0)
MCH: 27.3 pg (ref 26.0–34.0)
MCHC: 33.6 g/dL (ref 30.0–36.0)
MCV: 81.4 fL (ref 80.0–100.0)
Platelets: 251 K/uL (ref 150–400)
RBC: 3.77 MIL/uL — ABNORMAL LOW (ref 3.87–5.11)
RDW: 14.3 % (ref 11.5–15.5)
WBC: 6.3 K/uL (ref 4.0–10.5)
nRBC: 0 % (ref 0.0–0.2)

## 2024-04-04 LAB — TYPE AND SCREEN
ABO/RH(D): O POS
Antibody Screen: NEGATIVE

## 2024-04-04 LAB — GLUCOSE, CAPILLARY
Glucose-Capillary: 109 mg/dL — ABNORMAL HIGH (ref 70–99)
Glucose-Capillary: 99 mg/dL (ref 70–99)

## 2024-04-04 LAB — RPR: RPR Ser Ql: NONREACTIVE

## 2024-04-04 MED ORDER — FENTANYL-BUPIVACAINE-NACL 0.5-0.125-0.9 MG/250ML-% EP SOLN
12.0000 mL/h | EPIDURAL | Status: DC | PRN
  Administered 2024-04-04: 12 mL/h via EPIDURAL
  Filled 2024-04-04: qty 250

## 2024-04-04 MED ORDER — OXYCODONE-ACETAMINOPHEN 5-325 MG PO TABS: 2.0000 | ORAL_TABLET | ORAL | Status: DC | PRN

## 2024-04-04 MED ORDER — WITCH HAZEL-GLYCERIN EX PADS: 1.0000 | MEDICATED_PAD | CUTANEOUS | Status: DC | PRN

## 2024-04-04 MED ORDER — LIDOCAINE HCL (PF) 1 % IJ SOLN
INTRAMUSCULAR | Status: DC | PRN
  Administered 2024-04-04: 5 mL via EPIDURAL

## 2024-04-04 MED ORDER — ACETAMINOPHEN 325 MG PO TABS
650.0000 mg | ORAL_TABLET | ORAL | Status: DC | PRN
  Administered 2024-04-04 (×2): 650 mg via ORAL
  Filled 2024-04-04 (×2): qty 2

## 2024-04-04 MED ORDER — SENNOSIDES-DOCUSATE SODIUM 8.6-50 MG PO TABS
2.0000 | ORAL_TABLET | Freq: Every day | ORAL | Status: DC
  Administered 2024-04-05 – 2024-04-06 (×2): 2 via ORAL
  Filled 2024-04-04 (×2): qty 2

## 2024-04-04 MED ORDER — SIMETHICONE 80 MG PO CHEW
80.0000 mg | CHEWABLE_TABLET | ORAL | Status: DC | PRN
Start: 2024-04-04 — End: 2024-04-06

## 2024-04-04 MED ORDER — DIPHENHYDRAMINE HCL 50 MG/ML IJ SOLN: 12.5000 mg | INTRAMUSCULAR | Status: DC | PRN

## 2024-04-04 MED ORDER — DIPHENHYDRAMINE HCL 25 MG PO CAPS: 25.0000 mg | ORAL_CAPSULE | Freq: Four times a day (QID) | ORAL | Status: DC | PRN

## 2024-04-04 MED ORDER — OXYTOCIN BOLUS FROM INFUSION
333.0000 mL | Freq: Once | INTRAVENOUS | Status: AC
  Administered 2024-04-04: 333 mL via INTRAVENOUS

## 2024-04-04 MED ORDER — DIBUCAINE (PERIANAL) 1 % EX OINT: 1.0000 | TOPICAL_OINTMENT | CUTANEOUS | Status: DC | PRN

## 2024-04-04 MED ORDER — ONDANSETRON HCL 4 MG/2ML IJ SOLN: 4.0000 mg | INTRAMUSCULAR | Status: DC | PRN

## 2024-04-04 MED ORDER — LACTATED RINGERS IV SOLN: 500.0000 mL | INTRAVENOUS | Status: DC | PRN

## 2024-04-04 MED ORDER — MISOPROSTOL 50MCG HALF TABLET
50.0000 ug | ORAL_TABLET | Freq: Once | ORAL | Status: AC
  Administered 2024-04-04: 50 ug via ORAL
  Filled 2024-04-04: qty 1

## 2024-04-04 MED ORDER — LIDOCAINE HCL (PF) 1 % IJ SOLN: 30.0000 mL | INTRAMUSCULAR | Status: DC | PRN

## 2024-04-04 MED ORDER — PHENYLEPHRINE 80 MCG/ML (10ML) SYRINGE FOR IV PUSH (FOR BLOOD PRESSURE SUPPORT): 80.0000 ug | PREFILLED_SYRINGE | INTRAVENOUS | Status: DC | PRN

## 2024-04-04 MED ORDER — IBUPROFEN 600 MG PO TABS
600.0000 mg | ORAL_TABLET | Freq: Four times a day (QID) | ORAL | Status: DC
  Administered 2024-04-04 – 2024-04-06 (×5): 600 mg via ORAL
  Filled 2024-04-04 (×5): qty 1

## 2024-04-04 MED ORDER — PRENATAL MULTIVITAMIN CH
1.0000 | ORAL_TABLET | Freq: Every day | ORAL | Status: DC
  Administered 2024-04-04 – 2024-04-05 (×2): 1 via ORAL
  Filled 2024-04-04 (×3): qty 1

## 2024-04-04 MED ORDER — FENTANYL CITRATE (PF) 100 MCG/2ML IJ SOLN
50.0000 ug | INTRAMUSCULAR | Status: DC | PRN
  Administered 2024-04-04: 50 ug via INTRAVENOUS
  Filled 2024-04-04: qty 2

## 2024-04-04 MED ORDER — EPHEDRINE 5 MG/ML INJ: 10.0000 mg | INTRAVENOUS | Status: DC | PRN

## 2024-04-04 MED ORDER — MISOPROSTOL 25 MCG QUARTER TABLET
25.0000 ug | ORAL_TABLET | Freq: Once | ORAL | Status: AC
  Administered 2024-04-04: 25 ug via VAGINAL
  Filled 2024-04-04: qty 1

## 2024-04-04 MED ORDER — OXYCODONE-ACETAMINOPHEN 5-325 MG PO TABS: 1.0000 | ORAL_TABLET | ORAL | Status: DC | PRN

## 2024-04-04 MED ORDER — ZOLPIDEM TARTRATE 5 MG PO TABS: 5.0000 mg | ORAL_TABLET | Freq: Every evening | ORAL | Status: DC | PRN

## 2024-04-04 MED ORDER — TETANUS-DIPHTH-ACELL PERTUSSIS 5-2-15.5 LF-MCG/0.5 IM SUSP: 0.5000 mL | Freq: Once | INTRAMUSCULAR | Status: DC

## 2024-04-04 MED ORDER — TERBUTALINE SULFATE 1 MG/ML IJ SOLN: 0.2500 mg | Freq: Once | INTRAMUSCULAR | Status: DC | PRN

## 2024-04-04 MED ORDER — PENICILLIN G POT IN DEXTROSE 60000 UNIT/ML IV SOLN
3.0000 10*6.[IU] | INTRAVENOUS | Status: DC
  Administered 2024-04-04: 3 10*6.[IU] via INTRAVENOUS
  Filled 2024-04-04: qty 50

## 2024-04-04 MED ORDER — LACTATED RINGERS IV SOLN: INTRAVENOUS | Status: DC

## 2024-04-04 MED ORDER — ONDANSETRON HCL 4 MG/2ML IJ SOLN: 4.0000 mg | Freq: Four times a day (QID) | INTRAMUSCULAR | Status: DC | PRN

## 2024-04-04 MED ORDER — SOD CITRATE-CITRIC ACID 500-334 MG/5ML PO SOLN: 30.0000 mL | ORAL | Status: DC | PRN

## 2024-04-04 MED ORDER — OXYTOCIN-SODIUM CHLORIDE 30-0.9 UT/500ML-% IV SOLN
2.5000 [IU]/h | INTRAVENOUS | Status: DC
  Administered 2024-04-04: 2.5 [IU]/h via INTRAVENOUS
  Filled 2024-04-04: qty 500

## 2024-04-04 MED ORDER — COCONUT OIL OIL: 1.0000 | TOPICAL_OIL | Status: DC | PRN

## 2024-04-04 MED ORDER — LACTATED RINGERS IV SOLN
500.0000 mL | Freq: Once | INTRAVENOUS | Status: AC
  Administered 2024-04-04: 500 mL via INTRAVENOUS

## 2024-04-04 MED ORDER — ACETAMINOPHEN 325 MG PO TABS: 650.0000 mg | ORAL_TABLET | ORAL | Status: DC | PRN

## 2024-04-04 MED ORDER — BENZOCAINE-MENTHOL 20-0.5 % EX AERO: 1.0000 | INHALATION_SPRAY | CUTANEOUS | Status: DC | PRN

## 2024-04-04 MED ORDER — SODIUM CHLORIDE 0.9 % IV SOLN
5.0000 10*6.[IU] | Freq: Once | INTRAVENOUS | Status: AC
  Administered 2024-04-04: 5 10*6.[IU] via INTRAVENOUS
  Filled 2024-04-04: qty 5

## 2024-04-04 MED ORDER — ONDANSETRON HCL 4 MG PO TABS: 4.0000 mg | ORAL_TABLET | ORAL | Status: DC | PRN

## 2024-04-04 NOTE — H&P (Addendum)
 OBSTETRIC ADMISSION HISTORY AND PHYSICAL  Michele Phillips is a 33 y.o. female G2P1001 with IUP at [redacted]w[redacted]d by LMP presenting for labor induction. She reports +FMs, No LOF, no VB, no blurry vision, headaches or peripheral edema, and RUQ pain.   She plans on breast feeding. She request Bilateral tubule ligation for birth control. She received her prenatal care at [facility]   Dating: By LMP --->  Estimated Date of Delivery: 04/24/24  Sono:   @[redacted]w[redacted]d , CWD, normal anatomy, cephalic presentation, Anterior placenta, 2547g, 56% EFW        NURSING  PROVIDER  [facility] Dating by LMP  [facility] Model Traditional Anatomy U/S wnl  Initiated care at  [facility]	[place]  English               LAB RESULTS   Support Person FOB Genetics NIPS: LR AFP:       NT/IT (FT only)        Carrier Screen Horizon: neg 4  Rhogam  O/Positive/-- (04/09 1539) A1C/GTT Early HgbA1C: [ ]  needs early GTT Third trimester 2 hr GTT:   Flu Vaccine Received 02/2023      TDaP Vaccine   Blood Type O/Positive/-- (04/09 1539)O pos  RSV Vaccine   Antibody Negative (04/09 1539)neg  COVID Vaccine   Rubella 6.07 (04/09 1539)imm  Feeding Plan both RPR Non Reactive (08/14 1416)neg  Contraception bilateral tubal ligation HBsAg Negative (04/09 1539)neg  Circumcision If boy Undecided  HIV Non Reactive (08/14 1416)neg  Pediatrician  [facility]  HCVAb Non Reactive (04/09 1539)neg  Prenatal Classes        BTL Consent   Pap [ ]  pp pap Declined at new ob  BTL Pre-payment   GC/CT Initial:neg   36wks:    VBAC Consent   GBS For PCN allergy, check sensitivities   BRx Optimized? [ ]  yes   [ ]  no      DME Rx [ ]  BP cuff [ ]  Weight Scale Waterbirth  [ ]  Class [ ]  Consent [ ]  CNM visit  PHQ9 & GAD7 [  ] new OB [  ] 28 weeks  [  ] 36 weeks Induction  [ ]  Orders Entered [ ] Foley Y/N       Prenatal History/Complications:  gDM Obesity  Past Medical History: Past Medical History:  Diagnosis Date    Gestational diabetes    Hx of chlamydia infection    No pertinent past medical history     Past Surgical History: Past Surgical History:  Procedure Laterality Date   BREAST BIOPSY Left 2016    Obstetrical History: OB History     Gravida  2   Para  1   Term  1   Preterm  0   AB  0   Living  1      SAB      IAB      Ectopic      Multiple      Live Births  1           Social History Social History   Socioeconomic History   Marital status: Single    Spouse name: Not on file   Number of children: Not on file   Years of education: Not on file   Highest education level: Not on file  Occupational History   Not on file  Tobacco Use  Smoking status: Never    Passive exposure: Never   Smokeless tobacco: Never  Vaping Use   Vaping status: Never Used  Substance and Sexual Activity   Alcohol use: No   Drug use: No   Sexual activity: Yes    Partners: Male    Birth control/protection: Pill  Other Topics Concern   Not on file  Social History Narrative   Not on file   Social Drivers of Health   Financial Resource Strain: Not on file  Food Insecurity: Not on file  Transportation Needs: Not on file  Physical Activity: Not on file  Stress: Not on file  Social Connections: Not on file    Family History: Family History  Problem Relation Age of Onset   Healthy Mother    Healthy Father    Diabetes Maternal Grandmother    Hypertension Maternal Grandmother     Allergies: No Known Allergies  Medications Prior to Admission  Medication Sig Dispense Refill Last Dose/Taking   Accu-Chek Softclix Lancets lancets Use as instructed 100 each 12    acetaminophen  (TYLENOL ) 325 MG tablet Take 2 tablets (650 mg total) by mouth every 6 (six) hours as needed. 45 tablet 0    Blood Glucose Monitoring Suppl (ACCU-CHEK GUIDE) w/Device KIT 1 Device by Does not apply route as needed. 1 kit 0    Blood Pressure Monitoring (BLOOD PRESSURE KIT) DEVI 1 Device by Does not  apply route as needed. 1 each 0    Cyanocobalamin  (VITAMIN B 12 PO) Take by mouth.      docusate sodium  (COLACE) 250 MG capsule Take 1 capsule (250 mg total) by mouth daily. (Patient not taking: Reported on 03/29/2024) 10 capsule 0    ferrous sulfate  324 MG TBEC Take 1 tablet (324 mg total) by mouth every other day for 42 doses. 42 tablet 0    glucose blood (ACCU-CHEK GUIDE TEST) test strip Use as instructed 100 each 12    metFORMIN  (GLUCOPHAGE ) 500 MG tablet Take 1 tablet (500 mg total) by mouth at bedtime. (Patient not taking: Reported on 03/29/2024) 30 tablet 1    Misc. Devices (GOJJI WEIGHT SCALE) MISC 1 Device by Does not apply route as needed. 1 each 0    ondansetron  (ZOFRAN ) 4 MG tablet Take 1 tablet (4 mg total) by mouth every 8 (eight) hours as needed for nausea or vomiting. (Patient not taking: Reported on 03/29/2024) 20 tablet 0    Prenatal Vit-Fe Fumarate-FA (PRENATAL PO) Take by mouth.        Review of Systems   All systems reviewed and negative except as stated in HPI  Last menstrual period 07/10/2023. General appearance: alert, cooperative, and appears stated age Lungs: on RA Heart: regular rate and rhythm Abdomen: soft, non-tender  Presentation: cephalic Fetal monitoringBaseline: 150s bpm, Variability: Good {> 6 bpm), Accelerations: Reactive, and Decelerations: Absent Uterine activityFrequency: Every 6-7 minutes     Prenatal labs: ABO, Rh: O/Positive/-- (04/09 1539) Antibody: Negative (04/09 1539) Rubella: 6.07 (04/09 1539) RPR: Non Reactive (08/14 1416)  HBsAg: Negative (04/09 1539)  HIV: Non Reactive (08/14 1416)  GBS: Positive/-- (10/07 1641)    Lab Results  Component Value Date   GBS Positive (A) 03/29/2024   Anatomy US    Gestational Age    LMP:           35w 6d        Date:  07/10/23  EDD:   04/15/24  U/S Today:     34w 4d                                        EDD:   04/24/24  Best:          34w 4d     Det. By:  Early Ultrasound          EDD:   04/24/24                                      (09/24/23) Fetal growth is appropriate for gestational age.  Amniotic fluid normal good fetal activity seen.  Cephalic presentation. Antenatal testing is reassuring.  BPP 8/8   Immunization History  Administered Date(s) Administered   Tdap 01/16/2011    Prenatal Transfer Tool  Maternal Diabetes: Yes:  Diabetes Type:  Diet controlled Genetic Screening: Normal Maternal Ultrasounds/Referrals: Normal Fetal Ultrasounds or other Referrals:  Fetal echo d/t gDM  Maternal Substance Abuse:  No Significant Maternal Medications:  None Significant Maternal Lab Results: Group B Strep positive Number of Prenatal Visits:greater than 3 verified prenatal visits Maternal Vaccinations:TDap offered Other Comments:  None   No results found for this or any previous visit (from the past 24 hours).  Patient Active Problem List   Diagnosis Date Noted   Pregnant 04/04/2024   Positive GBS test 04/01/2024   Low maternal serum vitamin B12 02/08/2024   Anemia during pregnancy in second trimester 11/25/2023   Oral hypoglycemic controlled White classification A2 gestational diabetes mellitus (GDM) 10/02/2023   Obesity (BMI 30-39.9) 09/30/2023   Supervision of other normal pregnancy, antepartum 09/23/2023   Condyloma acuminatum in female 01/31/2011    Assessment/Plan:  Michele Phillips is a 33 y.o. G2P1001 at [redacted]w[redacted]d here for labor induction   #Labor: Continue to monitor, intervention per unit protocol #Pain: Per patient request #FWB: Category 1 #GBS status:  Positive : IV PCN #Feeding: Breastmilk  #Reproductive Life planning: Tubal Ligation, paperwork signed 8/14.  #Circ:  not applicable #gDM : Diet management q4hour CBG checks in latent labor, q2h in active labor.   Houston Samuels, DO  04/04/2024, 12:30 AM

## 2024-04-04 NOTE — Anesthesia Postprocedure Evaluation (Signed)
 Anesthesia Post Note  Patient: Tressie Ragin  Procedure(s) Performed: AN AD HOC LABOR EPIDURAL     Patient location during evaluation: Mother Baby Anesthesia Type: Epidural Level of consciousness: awake and alert Pain management: pain level controlled Vital Signs Assessment: post-procedure vital signs reviewed and stable Respiratory status: spontaneous breathing, nonlabored ventilation and respiratory function stable Cardiovascular status: stable Postop Assessment: no headache, no backache and epidural receding Anesthetic complications: no   No notable events documented.  Last Vitals:  Vitals:   04/04/24 1245 04/04/24 1545  BP: 122/77 127/78  Pulse:  (!) 58  Resp:  18  Temp:  37.1 C  SpO2:  100%    Last Pain:  Vitals:   04/04/24 1545  TempSrc: Oral  PainSc: 0-No pain   Pain Goal:                   Christen Bedoya

## 2024-04-04 NOTE — Anesthesia Preprocedure Evaluation (Signed)
 Anesthesia Evaluation  Patient identified by MRN, date of birth, ID band Patient awake    Reviewed: Allergy & Precautions, NPO status , Patient's Chart, lab work & pertinent test results  Airway Mallampati: II  TM Distance: >3 FB Neck ROM: Full    Dental no notable dental hx. (+) Teeth Intact, Dental Advisory Given   Pulmonary neg pulmonary ROS   Pulmonary exam normal breath sounds clear to auscultation       Cardiovascular negative cardio ROS Normal cardiovascular exam Rhythm:Regular Rate:Normal     Neuro/Psych negative neurological ROS  negative psych ROS   GI/Hepatic negative GI ROS, Neg liver ROS,,,  Endo/Other  diabetes    Renal/GU negative Renal ROS     Musculoskeletal negative musculoskeletal ROS (+)    Abdominal   Peds  Hematology Lab Results      Component                Value               Date                      WBC                      6.3                 04/04/2024                HGB                      10.3 (L)            04/04/2024                HCT                      30.7 (L)            04/04/2024                MCV                      81.4                04/04/2024                PLT                      251                 04/04/2024              Anesthesia Other Findings   Reproductive/Obstetrics (+) Pregnancy                              Anesthesia Physical Anesthesia Plan  ASA: 2  Anesthesia Plan: Epidural   Post-op Pain Management:    Induction:   PONV Risk Score and Plan:   Airway Management Planned:   Additional Equipment:   Intra-op Plan:   Post-operative Plan: Extubation in OR  Informed Consent: I have reviewed the patients History and Physical, chart, labs and discussed the procedure including the risks, benefits and alternatives for the proposed anesthesia with the patient or authorized representative who has indicated his/her  understanding and acceptance.       Plan Discussed with:   Anesthesia  Plan Comments: (37.1 wk G2P1 for LEA)         Anesthesia Quick Evaluation

## 2024-04-04 NOTE — Discharge Summary (Signed)
 Postpartum Discharge Summary  Date of Service updated 04/06/2024     Patient Name: Michele Phillips DOB: April 29, 1991 MRN: [id number]  Date of admission: 04/04/2024 Delivery date:04/04/2024 Delivering provider: BARBRA LANG PARAS Date of discharge: 04/16/2024  Admitting diagnosis: Pregnant [Z34.90] Intrauterine pregnancy: [redacted]w[redacted]d     Secondary diagnosis:  Principal Problem:   Pregnant  Additional problems: gestational diabetes    Discharge diagnosis: Term Pregnancy Delivered, Gestational Hypertension, and GDM A2                                              Post partum procedures:none Augmentation: AROM, Pitocin , and Cytotec  Complications: None  Hospital course: Induction of Labor With Vaginal Delivery   33 y.o. yo G2P2002 at [redacted]w[redacted]d was admitted to the hospital 04/04/2024 for induction of labor.  Indication for induction: A2 DM.  Patient had an labor course that was uncomplicated Membrane Rupture Time/Date: 8:20 AM,04/04/2024  Delivery Method:Vaginal, Spontaneous Operative Delivery:N/A Episiotomy: Nonenone Lacerations:  Nonenone Details of delivery can be found in separate delivery note.  Patient had a postpartum course complicated by none. Patient is discharged home 04/16/24.  Newborn Data: Birth date:04/04/2024 Birth time:9:07 AM Gender:Female Living status:Living Apgars:8 ,9  Weight:2.87 kg  Magnesium Sulfate received: No BMZ received: No Rhophylac:N/A MMR:N/A T-DaP:Given postpartum Flu: No RSV Vaccine received: No Transfusion:No  Immunizations received: Immunization History  Administered Date(s) Administered   Tdap 01/16/2011, 04/05/2024    Physical exam  Vitals:   04/05/24 2028 04/05/24 2130 04/06/24 0030 04/06/24 0433  BP: 137/80 131/71 132/82 134/81  Pulse: 61 60 (!) 59 (!) 55  Resp: 18  18 18   Temp: 98.3 F (36.8 C)  98.2 F (36.8 C) 98.3 F (36.8 C)  TempSrc: Oral     SpO2: 100%  100% 100%  Weight:      Height:       General: alert Lochia:  appropriate Uterine Fundus: firm, at the umbilicus Incision: Healing well with no significant drainage, No significant erythema, Dressing is clean, dry, and intact DVT Evaluation: No evidence of DVT seen on physical exam. No significant calf/ankle edema. Labs: Lab Results  Component Value Date   WBC 8.6 04/05/2024   HGB 10.2 (L) 04/05/2024   HCT 30.6 (L) 04/05/2024   MCV 81.8 04/05/2024   PLT 236 04/05/2024      Latest Ref Rng & Units 04/04/2024   12:52 AM  CMP  Glucose 70 - 99 mg/dL 895   BUN 6 - 20 mg/dL 5   Creatinine 9.55 - 8.99 mg/dL 9.35   Sodium 864 - 854 mmol/L 136   Potassium 3.5 - 5.1 mmol/L 3.4   Chloride 98 - 111 mmol/L 106   CO2 22 - 32 mmol/L 20   Calcium 8.9 - 10.3 mg/dL 9.1   Total Protein 6.5 - 8.1 g/dL 5.9   Total Bilirubin 0.0 - 1.2 mg/dL 0.7   Alkaline Phos 38 - 126 U/L 93   AST 15 - 41 U/L 14   ALT 0 - 44 U/L 8    Edinburgh Score:    04/12/2024    8:04 PM  Edinburgh Postnatal Depression Scale Screening Tool  I have been able to laugh and see the funny side of things. 0  I have looked forward with enjoyment to things. 0  I have blamed myself unnecessarily when things went wrong. 2  I have  been anxious or worried for no good reason. 0  I have felt scared or panicky for no good reason. 0  Things have been getting on top of me. 0  I have been so unhappy that I have had difficulty sleeping. 0  I have felt sad or miserable. 0  I have been so unhappy that I have been crying. 0  The thought of harming myself has occurred to me. 0  Edinburgh Postnatal Depression Scale Total 2   No data recorded  After visit meds:  Allergies as of 04/06/2024   No Known Allergies      Medication List     STOP taking these medications    Accu-Chek Guide Test test strip Generic drug: glucose blood   Accu-Chek Guide w/Device Kit   Accu-Chek Softclix Lancets lancets   docusate sodium  250 MG capsule Commonly known as: COLACE   Gojji Weight Scale Misc    ondansetron  4 MG tablet Commonly known as: Zofran        TAKE these medications    Acetaminophen  Extra Strength 500 MG Tabs Take 2 tablets (1,000 mg total) by mouth every 6 (six) hours as needed for moderate pain (pain score 4-6). What changed:  medication strength how much to take reasons to take this   Blood Pressure Kit Devi 1 Device by Does not apply route as needed.   ferrous sulfate  324 MG Tbec Take 1 tablet (324 mg total) by mouth every other day for 42 doses.   furosemide 20 MG tablet Commonly known as: LASIX Take 1 tablet (20 mg total) by mouth daily.   ibuprofen  600 MG tablet Commonly known as: ADVIL  Take 1 tablet (600 mg total) by mouth every 6 (six) hours as needed for moderate pain (pain score 4-6).   metFORMIN  500 MG tablet Commonly known as: GLUCOPHAGE  Take 1 tablet (500 mg total) by mouth at bedtime.   NIFEdipine 30 MG 24 hr tablet Commonly known as: ADALAT CC Take 1 tablet (30 mg total) by mouth daily.   potassium chloride SA 20 MEQ tablet Commonly known as: KLOR-CON M Take 1 tablet (20 mEq total) by mouth daily.   PRENATAL PO Take by mouth.   Stool Softener/Laxative 50-8.6 MG tablet Generic drug: senna-docusate Take 2 tablets by mouth daily.   VITAMIN B 12 PO Take by mouth.         Discharge home in stable condition Infant Feeding: Breast Infant Disposition:home with mother Discharge instruction: per After Visit Summary and Postpartum booklet. Activity: Advance as tolerated. Pelvic rest for 6 weeks.  Diet: routine diet Future Appointments: Future Appointments  Date Time Provider Department Center  05/02/2024  8:40 AM [facility]  05/02/2024 10:15 AM Delores Nidia CROME, FNP [facility]   Follow up Visit:  Follow-up Information     [facility] for [facility] Follow up.   Specialty: Obstetrics and Gynecology Contact information: [address] [place]   [id number] [phone]                 Please schedule this patient for a In person postpartum visit in 4 weeks with the following provider: Any provider. Additional Postpartum F/U:2 hour GTT  High risk pregnancy complicated by: GDM Delivery mode:  Vaginal, Spontaneous Anticipated Birth Control:  BTL done [facility]   04/16/2024 Barabara Maier, DO

## 2024-04-04 NOTE — Anesthesia Procedure Notes (Signed)
 Epidural Patient location during procedure: OB Start time: 04/04/2024 5:39 AM End time: 04/04/2024 5:52 AM  Staffing Anesthesiologist: Jefm Garnette LABOR, MD Performed: anesthesiologist   Preanesthetic Checklist Completed: patient identified, IV checked, site marked, risks and benefits discussed, surgical consent, monitors and equipment checked, pre-op evaluation and timeout performed  Epidural Patient position: sitting Prep: DuraPrep and site prepped and draped Patient monitoring: continuous pulse ox and blood pressure Approach: midline Location: L3-L4 Injection technique: LOR air  Needle:  Needle type: Tuohy  Needle gauge: 17 G Needle length: 9 cm and 9 Needle insertion depth: 8 cm Catheter type: closed end flexible Catheter size: 19 Gauge Catheter at skin depth: 14 cm Test dose: negative  Assessment Events: blood not aspirated, no cerebrospinal fluid, injection not painful, no injection resistance, no paresthesia and negative IV test  Additional Notes Patient identified. Risks/Benefits/Options discussed with patient including but not limited to bleeding, infection, nerve damage, paralysis, failed block, incomplete pain control, headache, blood pressure changes, nausea, vomiting, reactions to medication both or allergic, itching and postpartum back pain. Confirmed with bedside nurse the patient's most recent platelet count. Confirmed with patient that they are not currently taking any anticoagulation, have any bleeding history or any family history of bleeding disorders. Patient expressed understanding and wished to proceed. All questions were answered. Sterile technique was used throughout the entire procedure. Please see nursing notes for vital signs. Test dose was given through epidural needle and negative prior to continuing to dose epidural or start infusion. Warning signs of high block given to the patient including shortness of breath, tingling/numbness in hands, complete  motor block, or any concerning symptoms with instructions to call for help. Patient was given instructions on fall risk and not to get out of bed. All questions and concerns addressed with instructions to call with any issues.  1 Attempt (S) . Patient tolerated procedure well.

## 2024-04-04 NOTE — Lactation Note (Signed)
 This note was copied from a baby's chart. Lactation Consultation Note  Patient Name: Michele Phillips Date: 04/04/2024 Age:34 hours Reason for consult: Initial assessment;1st time breastfeeding;Early term 37-38.6wks.  MOB feels infant is latching well. MOB would like assistance with infant latching on her right breast has been having pain and discomfort when infant latches on that side. MOB was given pillow support and infant latched with depth and was still breastfeeding after 13 minutes when LC left the room. MOB knows to ask for further latch assistance if needed. MOB will continue to breastfeed infant by cues, on demand, 8-12 times within 24 hours, skin to skin. LC discussed importance of maternal rest, meals and hydration. MOB was made aware of O/P services, breastfeeding support groups, community resources, and our phone # for post-discharge questions.    Maternal Data Has patient been taught Hand Expression?: Yes Does the patient have breastfeeding experience prior to this delivery?: No  Feeding Mother's Current Feeding Choice: Breast Milk  LATCH Score Latch: Grasps breast easily, tongue down, lips flanged, rhythmical sucking.  Audible Swallowing: Spontaneous and intermittent  Type of Nipple: Everted at rest and after stimulation  Comfort (Breast/Nipple): Soft / non-tender  Hold (Positioning): Assistance needed to correctly position infant at breast and maintain latch.  LATCH Score: 9   Lactation Tools Discussed/Used    Interventions Interventions: Breast feeding basics reviewed;Assisted with latch;Skin to skin;Adjust position;Support pillows;Position options;Expressed milk;Breast compression;Education;CDC milk storage guidelines;CDC Guidelines for Breast Pump Cleaning;LC Services brochure  Discharge Pump: DEBP;Personal  Consult Status Consult Status: Follow-up Date: 04/05/24 Follow-up type: In-patient    [name] 04/04/2024, 3:34 PM

## 2024-04-04 NOTE — Plan of Care (Signed)

## 2024-04-05 ENCOUNTER — Inpatient Hospital Stay (HOSPITAL_COMMUNITY): Admitting: Anesthesiology

## 2024-04-05 ENCOUNTER — Encounter: Admitting: Obstetrics and Gynecology

## 2024-04-05 ENCOUNTER — Encounter (HOSPITAL_COMMUNITY): Admission: RE | Disposition: A | Payer: Self-pay | Source: Home / Self Care | Attending: Family Medicine

## 2024-04-05 DIAGNOSIS — Z302 Encounter for sterilization: Secondary | ICD-10-CM

## 2024-04-05 LAB — CBC
HCT: 30.6 % — ABNORMAL LOW (ref 36.0–46.0)
Hemoglobin: 10.2 g/dL — ABNORMAL LOW (ref 12.0–15.0)
MCH: 27.3 pg (ref 26.0–34.0)
MCHC: 33.3 g/dL (ref 30.0–36.0)
MCV: 81.8 fL (ref 80.0–100.0)
Platelets: 236 K/uL (ref 150–400)
RBC: 3.74 MIL/uL — ABNORMAL LOW (ref 3.87–5.11)
RDW: 14.4 % (ref 11.5–15.5)
WBC: 8.6 K/uL (ref 4.0–10.5)
nRBC: 0 % (ref 0.0–0.2)

## 2024-04-05 LAB — ABO/RH: ABO/RH(D): O POS

## 2024-04-05 SURGERY — LIGATION, FALLOPIAN TUBE, POSTPARTUM
Anesthesia: Epidural | Site: Abdomen | Laterality: Bilateral

## 2024-04-05 MED ORDER — SCOPOLAMINE 1 MG/3DAYS TD PT72: 1.0000 | MEDICATED_PATCH | Freq: Once | TRANSDERMAL | Status: DC

## 2024-04-05 MED ORDER — METOCLOPRAMIDE HCL 10 MG PO TABS
10.0000 mg | ORAL_TABLET | Freq: Once | ORAL | Status: AC
  Administered 2024-04-05: 10 mg via ORAL
  Filled 2024-04-05: qty 1

## 2024-04-05 MED ORDER — INFLUENZA VIRUS VACC SPLIT PF (FLUZONE) 0.5 ML IM SUSY
0.5000 mL | PREFILLED_SYRINGE | INTRAMUSCULAR | Status: DC
  Filled 2024-04-05: qty 0.5

## 2024-04-05 MED ORDER — STERILE WATER FOR IRRIGATION IR SOLN
Status: DC | PRN
  Administered 2024-04-05: 1000 mL

## 2024-04-05 MED ORDER — DIPHENHYDRAMINE HCL 50 MG/ML IJ SOLN: 12.5000 mg | INTRAMUSCULAR | Status: DC | PRN

## 2024-04-05 MED ORDER — DIPHENHYDRAMINE HCL 25 MG PO CAPS: 25.0000 mg | ORAL_CAPSULE | ORAL | Status: DC | PRN

## 2024-04-05 MED ORDER — ACETAMINOPHEN 500 MG PO TABS
1000.0000 mg | ORAL_TABLET | Freq: Four times a day (QID) | ORAL | Status: DC
  Administered 2024-04-05 – 2024-04-06 (×4): 1000 mg via ORAL
  Filled 2024-04-05 (×3): qty 2

## 2024-04-05 MED ORDER — LIDOCAINE-EPINEPHRINE (PF) 2 %-1:200000 IJ SOLN
INTRAMUSCULAR | Status: DC | PRN
Start: 2024-04-05 — End: 2024-04-05
  Administered 2024-04-05: 7 mL via EPIDURAL
  Administered 2024-04-05: 3 mL via EPIDURAL
  Administered 2024-04-05: 10 mL via EPIDURAL

## 2024-04-05 MED ORDER — SOD CITRATE-CITRIC ACID 500-334 MG/5ML PO SOLN
ORAL | Status: AC
  Filled 2024-04-05: qty 30

## 2024-04-05 MED ORDER — KETOROLAC TROMETHAMINE 30 MG/ML IJ SOLN: 30.0000 mg | Freq: Four times a day (QID) | INTRAMUSCULAR | Status: DC | PRN

## 2024-04-05 MED ORDER — DEXAMETHASONE SOD PHOSPHATE PF 10 MG/ML IJ SOLN
INTRAMUSCULAR | Status: DC | PRN
  Administered 2024-04-05: 5 mg via INTRAVENOUS

## 2024-04-05 MED ORDER — MEPERIDINE HCL 25 MG/ML IJ SOLN: 6.2500 mg | INTRAMUSCULAR | Status: DC | PRN

## 2024-04-05 MED ORDER — FAMOTIDINE 20 MG PO TABS
40.0000 mg | ORAL_TABLET | Freq: Once | ORAL | Status: AC
  Administered 2024-04-05: 40 mg via ORAL
  Filled 2024-04-05: qty 2

## 2024-04-05 MED ORDER — ONDANSETRON HCL 4 MG/2ML IJ SOLN
INTRAMUSCULAR | Status: DC | PRN
  Administered 2024-04-05: 4 mg via INTRAVENOUS

## 2024-04-05 MED ORDER — SODIUM CHLORIDE 0.9% FLUSH: 3.0000 mL | INTRAVENOUS | Status: DC | PRN

## 2024-04-05 MED ORDER — MORPHINE SULFATE (PF) 0.5 MG/ML IJ SOLN
INTRAMUSCULAR | Status: AC
  Filled 2024-04-05: qty 10

## 2024-04-05 MED ORDER — AMISULPRIDE (ANTIEMETIC) 5 MG/2ML IV SOLN: 10.0000 mg | Freq: Once | INTRAVENOUS | Status: DC | PRN

## 2024-04-05 MED ORDER — OXYCODONE HCL 5 MG/5ML PO SOLN: 5.0000 mg | Freq: Once | ORAL | Status: DC | PRN

## 2024-04-05 MED ORDER — LACTATED RINGERS IV SOLN: INTRAVENOUS | Status: AC

## 2024-04-05 MED ORDER — OXYCODONE HCL 5 MG PO TABS: 5.0000 mg | ORAL_TABLET | Freq: Once | ORAL | Status: DC | PRN

## 2024-04-05 MED ORDER — BUPIVACAINE-EPINEPHRINE 0.25% -1:200000 IJ SOLN
INTRAMUSCULAR | Status: DC | PRN
  Administered 2024-04-05: 10 mL

## 2024-04-05 MED ORDER — NALOXONE HCL 0.4 MG/ML IJ SOLN: 0.4000 mg | INTRAMUSCULAR | Status: DC | PRN

## 2024-04-05 MED ORDER — DEXMEDETOMIDINE HCL IN NACL 80 MCG/20ML IV SOLN
INTRAVENOUS | Status: AC
  Filled 2024-04-05: qty 20

## 2024-04-05 MED ORDER — SODIUM CHLORIDE 0.9 % IV SOLN: 12.5000 mg | INTRAVENOUS | Status: DC | PRN

## 2024-04-05 MED ORDER — ONDANSETRON HCL 4 MG/2ML IJ SOLN: 4.0000 mg | Freq: Three times a day (TID) | INTRAMUSCULAR | Status: DC | PRN

## 2024-04-05 MED ORDER — PROPOFOL 10 MG/ML IV BOLUS
INTRAVENOUS | Status: AC
  Filled 2024-04-05: qty 20

## 2024-04-05 MED ORDER — CHLORHEXIDINE GLUCONATE 0.12 % MT SOLN
OROMUCOSAL | Status: AC
  Filled 2024-04-05: qty 15

## 2024-04-05 MED ORDER — ONDANSETRON HCL 4 MG/2ML IJ SOLN
INTRAMUSCULAR | Status: AC
  Filled 2024-04-05: qty 2

## 2024-04-05 MED ORDER — POTASSIUM CHLORIDE CRYS ER 20 MEQ PO TBCR
20.0000 meq | EXTENDED_RELEASE_TABLET | Freq: Every day | ORAL | Status: DC
Start: 2024-04-05 — End: 2024-04-10
  Administered 2024-04-05 – 2024-04-06 (×2): 20 meq via ORAL
  Filled 2024-04-05 (×2): qty 1

## 2024-04-05 MED ORDER — FENTANYL CITRATE (PF) 250 MCG/5ML IJ SOLN
INTRAMUSCULAR | Status: DC | PRN
  Administered 2024-04-05: 50 ug via INTRAVENOUS

## 2024-04-05 MED ORDER — FAMOTIDINE 20 MG PO TABS
ORAL_TABLET | ORAL | Status: AC
  Filled 2024-04-05: qty 1

## 2024-04-05 MED ORDER — CHLORHEXIDINE GLUCONATE 0.12 % MT SOLN
15.0000 mL | Freq: Once | OROMUCOSAL | Status: AC
  Administered 2024-04-05: 15 mL via OROMUCOSAL

## 2024-04-05 MED ORDER — ORAL CARE MOUTH RINSE: 15.0000 mL | Freq: Once | OROMUCOSAL | Status: AC

## 2024-04-05 MED ORDER — LIDOCAINE-EPINEPHRINE (PF) 2 %-1:200000 IJ SOLN
INTRAMUSCULAR | Status: AC
  Filled 2024-04-05: qty 20

## 2024-04-05 MED ORDER — ACETAMINOPHEN 160 MG/5ML PO SOLN: 960.0000 mg | Freq: Once | ORAL | Status: DC

## 2024-04-05 MED ORDER — NALOXONE HCL 4 MG/10ML IJ SOLN: 1.0000 ug/kg/h | INTRAVENOUS | Status: DC | PRN

## 2024-04-05 MED ORDER — PHENYLEPHRINE HCL (PRESSORS) 10 MG/ML IV SOLN
INTRAVENOUS | Status: DC | PRN
  Administered 2024-04-05: 80 ug via INTRAVENOUS

## 2024-04-05 MED ORDER — SCOPOLAMINE 1 MG/3DAYS TD PT72
1.0000 | MEDICATED_PATCH | Freq: Once | TRANSDERMAL | Status: DC
  Administered 2024-04-05: 1 mg via TRANSDERMAL

## 2024-04-05 MED ORDER — MIDAZOLAM HCL 2 MG/2ML IJ SOLN
INTRAMUSCULAR | Status: AC
  Filled 2024-04-05: qty 2

## 2024-04-05 MED ORDER — ACETAMINOPHEN 10 MG/ML IV SOLN
INTRAVENOUS | Status: DC | PRN
  Administered 2024-04-05: 1000 mg via INTRAVENOUS

## 2024-04-05 MED ORDER — FAMOTIDINE 20 MG PO TABS
20.0000 mg | ORAL_TABLET | Freq: Once | ORAL | Status: AC
  Administered 2024-04-05: 20 mg via ORAL

## 2024-04-05 MED ORDER — SOD CITRATE-CITRIC ACID 500-334 MG/5ML PO SOLN
30.0000 mL | Freq: Once | ORAL | Status: AC
  Administered 2024-04-05: 30 mL via ORAL

## 2024-04-05 MED ORDER — FENTANYL CITRATE (PF) 250 MCG/5ML IJ SOLN
INTRAMUSCULAR | Status: AC
  Filled 2024-04-05: qty 5

## 2024-04-05 MED ORDER — LACTATED RINGERS IV SOLN: INTRAVENOUS | Status: DC

## 2024-04-05 MED ORDER — ACETAMINOPHEN 500 MG PO TABS: 1000.0000 mg | ORAL_TABLET | Freq: Once | ORAL | Status: DC

## 2024-04-05 MED ORDER — FUROSEMIDE 20 MG PO TABS
20.0000 mg | ORAL_TABLET | Freq: Every day | ORAL | Status: DC
  Administered 2024-04-05 – 2024-04-06 (×2): 20 mg via ORAL
  Filled 2024-04-05 (×2): qty 1

## 2024-04-05 MED ORDER — KETOROLAC TROMETHAMINE 30 MG/ML IJ SOLN
INTRAMUSCULAR | Status: DC | PRN
  Administered 2024-04-05: 30 mg via INTRAVENOUS

## 2024-04-05 MED ORDER — MIDAZOLAM HCL 5 MG/5ML IJ SOLN
INTRAMUSCULAR | Status: DC | PRN
Start: 2024-04-05 — End: 2024-04-05
  Administered 2024-04-05 (×2): 1 mg via INTRAVENOUS

## 2024-04-05 MED ORDER — LACTATED RINGERS IV SOLN: INTRAVENOUS | Status: DC | PRN

## 2024-04-05 MED ORDER — TETANUS-DIPHTH-ACELL PERTUSSIS 5-2-15.5 LF-MCG/0.5 IM SUSP
0.5000 mL | Freq: Once | INTRAMUSCULAR | Status: AC
  Administered 2024-04-05: 0.5 mL via INTRAMUSCULAR
  Filled 2024-04-05: qty 0.5

## 2024-04-05 MED ORDER — DEXMEDETOMIDINE HCL IN NACL 80 MCG/20ML IV SOLN
INTRAVENOUS | Status: DC | PRN
  Administered 2024-04-05 (×2): 8 ug via INTRAVENOUS
  Administered 2024-04-05: 4 ug via INTRAVENOUS

## 2024-04-05 MED ORDER — SCOPOLAMINE 1 MG/3DAYS TD PT72
MEDICATED_PATCH | TRANSDERMAL | Status: AC
Start: 2024-04-05 — End: 2024-04-05
  Filled 2024-04-05: qty 1

## 2024-04-05 MED ORDER — FENTANYL CITRATE (PF) 100 MCG/2ML IJ SOLN: 25.0000 ug | INTRAMUSCULAR | Status: DC | PRN

## 2024-04-05 SURGICAL SUPPLY — 25 items
BLADE SURG 15 STRL LF C SS BP (BLADE) ×1 IMPLANT
DRSG OPSITE POSTOP 3X4 (GAUZE/BANDAGES/DRESSINGS) ×1 IMPLANT
DURAPREP 26ML APPLICATOR (WOUND CARE) ×1 IMPLANT
GAUZE SPONGE 2X2 STRL 8-PLY (GAUZE/BANDAGES/DRESSINGS) ×1 IMPLANT
GLOVE BIO SURGEON STRL SZ7.5 (GLOVE) ×1 IMPLANT
GLOVE BIOGEL PI IND STRL 7.0 (GLOVE) ×1 IMPLANT
GLOVE BIOGEL PI IND STRL 8 (GLOVE) ×1 IMPLANT
GOWN STRL REUS W/TWL LRG LVL3 (GOWN DISPOSABLE) ×1 IMPLANT
GOWN STRL REUS W/TWL XL LVL3 (GOWN DISPOSABLE) ×1 IMPLANT
LIGASURE IMPACT 36 18CM CVD LR (INSTRUMENTS) ×1 IMPLANT
MAT PREVALON FULL STRYKER (MISCELLANEOUS) IMPLANT
NEEDLE HYPO 22GX1.5 SAFETY (NEEDLE) ×1 IMPLANT
NS IRRIG 1000ML POUR BTL (IV SOLUTION) ×1 IMPLANT
PACK ABDOMINAL MINOR (CUSTOM PROCEDURE TRAY) ×1 IMPLANT
PROTECTOR NERVE ULNAR (MISCELLANEOUS) ×1 IMPLANT
SPONGE LAP 4X18 RFD (DISPOSABLE) ×1 IMPLANT
SUT MNCRL AB 3-0 PS2 27 (SUTURE) ×1 IMPLANT
SUT MON AB 2-0 CT1 36 (SUTURE) IMPLANT
SUT PLAIN 0 NONE (SUTURE) IMPLANT
SUT PROLENE 1 CT (SUTURE) IMPLANT
SUT VICRYL 0 UR6 27IN ABS (SUTURE) ×1 IMPLANT
SYR CONTROL 10ML LL (SYRINGE) ×1 IMPLANT
TOWEL OR 17X24 6PK STRL BLUE (TOWEL DISPOSABLE) ×2 IMPLANT
TRAY FOLEY W/BAG SLVR 14FR (SET/KITS/TRAYS/PACK) ×1 IMPLANT
WATER STERILE IRR 1000ML POUR (IV SOLUTION) ×1 IMPLANT

## 2024-04-05 NOTE — Transfer of Care (Signed)
 Immediate Anesthesia Transfer of Care Note  Patient: Michele Phillips  Procedure(s) Performed: LIGATION, FALLOPIAN TUBE, POSTPARTUM (Bilateral: Abdomen)  Patient Location: PACU  Anesthesia Type:Epidural  Level of Consciousness: drowsy  Airway & Oxygen Therapy: Patient Spontanous Breathing  Post-op Assessment: Report given to RN and Post -op Vital signs reviewed and stable  Post vital signs: Reviewed and stable  Last Vitals:  Vitals Value Taken Time  BP    Temp    Pulse    Resp    SpO2      Last Pain:  Vitals:   04/05/24 1053  TempSrc: Oral  PainSc:          Complications: No notable events documented.

## 2024-04-05 NOTE — Op Note (Signed)
 Postpartum Tubal Ligation Operative Note   Patient: Michele Phillips  Date of Procedure: 04/05/2024  Procedure: Postpartum bilateral Tubal Ligation via Bilateral salpingectomy   Indications: undesired fertility  Pre-operative Diagnosis: Bilateral Tubal Ligation, patient desires sterilization.   Post-operative Diagnosis: Same  Surgeon: Surgeons and Role:    * Ilean Norleen GAILS, MD - Primary  Assistants: Charlie DELENA Courts MD  An experienced assistant was required given the standard of surgical care given the complexity of the case.  This assistant was needed for exposure, dissection, suctioning, retraction, instrument exchange, assisting with delivery with administration of fundal pressure, and for overall help during the procedure.   Anesthesia: epidural  Anesthesiologist: No responsible provider has been recorded for the case.   Antibiotics: None   Estimated Blood Loss: 5 ml   Total IV Fluids: 800 ml  Urine Output: Foley placed after completion of surgery  Specimens: Bilateral fallopian tubes   Complications: no complications   Indications: Michele Phillips is a 33 y.o. [id number] with undesired fertility, status post vaginal delivery, desires permanent sterilization.  Other reversible forms of contraception were discussed with patient; she declines all other modalities. Risks of procedure discussed with patient including but not limited to: risk of regret, permanence of method, bleeding, infection, injury to surrounding organs and need for additional procedures.  Failure risk of 1-2 % with increased risk of ectopic gestation if pregnancy occurs and possibility of post-tubal pain syndrome also discussed with patient.  Findings: Normal uterus, Normal bilateral fallopian tubes, Normal bilateral ovaries.  Procedure Details: The patient was taken to the operating room where epidural anesthesia was dosed and found to be adequate.  She was then placed in the dorsal supine position and  prepped and draped in sterile fashion.  After an adequate timeout was performed, attention was turned to the patient's abdomen where a small transverse skin incision was made under the umbilical fold. The incision was taken down to the layer of fascia using the scalpel, and fascia was incised, and the incision was extended bilaterally. The peritoneum was entered in a sharp fashion.   Attention was then turned to the fallopian tubes. The left Fallopian tube was identified and then traced to it's fimbriae. Using the Ligasure device and taking care to avoid large vascular structures, the left fallopian tube was removed sequentially from the fimbriae to the cornua, with excellent hemostasis noted. Attention was then turned to the right fallopian tube, and after confirmation of identification by tracing the tube out to the fimbriae, the same procedure was then performed on with excellent hemostasis noted. SABRA Richard hemostasis was noted overall. All laps and instruments were then removed from the patient's abdomen and the fascial incision was repaired with 0 Vicryl. The subcutaneous tissue was reapproximated. The skin was closed with a 4-0 Monocryl subcuticular stitch. The patient tolerated the procedure well.  Instrument, sponge, and needle counts were correct times three.  The patient was then taken to the recovery room awake and in stable condition.  Disposition: PACU - hemodynamically stable.    Signed: Charlie DELENA Courts, MD OB Fellow, Faculty [facility] for [facility], [facility]

## 2024-04-05 NOTE — Progress Notes (Signed)
 POSTPARTUM PROGRESS NOTE Postpartum Day 1  Subjective: Pleasant Michele Phillips is a 33 y.o. [id number] s/p NVD at [redacted]w[redacted]d.  She reports she is doing well. No acute events overnight. She denies any problems with ambulating, voiding or po intake. Denies nausea or vomiting.  Pain is moderately controlled.  Lochia is appropriate.  Objective: Blood pressure 122/88, pulse 60, temperature 98.7 F (37.1 C), temperature source Oral, resp. rate 18, height 5' 2 (1.575 m), weight 85.3 kg, last menstrual period 07/10/2023, SpO2 100%, unknown if currently breastfeeding.  Physical Exam:  General: alert, cooperative and no distress Heart:regular rate Resp: nonlabored Uterine Fundus: firm, at the umbilicus; appropriately tender Extremities:  none edema Skin: warm, dry  Recent Labs    04/04/24 0052 04/05/24 0506  HGB 10.3* 10.2*  HCT 30.7* 30.6*    Assessment/Plan: Michele Phillips is a 33 y.o. [id number] s/p NVD at [redacted]w[redacted]d   PPD#1 - meeting milestones - continue routine postpartum care  gHTN - new during admission - start Lasix + K x5d  Unwanted fertility Desires BTS - scheduled for today  Feeding: breast Contraception: BTS  Dispo: Plan for discharge PPD2.  LOS: 1 day    Michele Maier, DO FMOB Fellow, Faculty Practice [facility], Center for [employer]

## 2024-04-05 NOTE — Anesthesia Postprocedure Evaluation (Signed)
 Anesthesia Post Note  Patient: Michele Phillips  Procedure(s) Performed: LIGATION, FALLOPIAN TUBE, POSTPARTUM (Bilateral: Abdomen)     Patient location during evaluation: PACU Anesthesia Type: Epidural Level of consciousness: awake and alert, oriented and patient cooperative Pain management: pain level controlled Vital Signs Assessment: post-procedure vital signs reviewed and stable Respiratory status: spontaneous breathing, nonlabored ventilation and respiratory function stable Cardiovascular status: blood pressure returned to baseline and stable Postop Assessment: no apparent nausea or vomiting, no headache, no backache and epidural receding Anesthetic complications: no   No notable events documented.  Last Vitals:  Vitals:   04/05/24 1420 04/05/24 1445  BP:  118/72  Pulse: (!) 54 (!) 56  Resp: 13 12  Temp:    SpO2: 98% 99%    Last Pain:  Vitals:   04/05/24 1445  TempSrc:   PainSc: Asleep   Pain Goal:    LLE Motor Response: Purposeful movement (04/05/24 1445) LLE Sensation: Tingling (04/05/24 1445) RLE Motor Response: Purposeful movement (04/05/24 1445) RLE Sensation: Tingling (04/05/24 1445)     Epidural/Spinal Function Cutaneous sensation: Pins and Needles (04/05/24 1445), Patient able to flex knees: Yes (04/05/24 1445), Patient able to lift hips off bed: No (04/05/24 1445), Back pain beyond tenderness at insertion site: No (04/05/24 1445), Progressively worsening motor and/or sensory loss: No (04/05/24 1445), Bowel and/or bladder incontinence post epidural: No (04/05/24 1445)  [name]

## 2024-04-05 NOTE — Anesthesia Preprocedure Evaluation (Addendum)
 Anesthesia Evaluation  Patient identified by MRN, date of birth, ID band Patient awake    Reviewed: Allergy & Precautions, NPO status , Patient's Chart, lab work & pertinent test results  History of Anesthesia Complications Negative for: history of anesthetic complications  Airway Mallampati: II   Neck ROM: Full    Dental  (+) Dental Advisory Given, Teeth Intact   Pulmonary neg pulmonary ROS   Pulmonary exam normal        Cardiovascular negative cardio ROS Normal cardiovascular exam     Neuro/Psych negative neurological ROS  negative psych ROS   GI/Hepatic negative GI ROS, Neg liver ROS,,,  Endo/Other  diabetes, Gestational, Oral Hypoglycemic Agents   Obesity   Renal/GU negative Renal ROS     Musculoskeletal negative musculoskeletal ROS (+)    Abdominal   Peds  Hematology  (+) Blood dyscrasia, anemia   Anesthesia Other Findings   Reproductive/Obstetrics  S/p NSVD yesterday with epidural for labor pain                               Anesthesia Physical Anesthesia Plan  ASA: 2  Anesthesia Plan: Epidural   Post-op Pain Management: Tylenol  PO (pre-op)* and Epidural*   Induction:   PONV Risk Score and Plan: 2 and Treatment may vary due to age or medical condition, Ondansetron  and Dexamethasone  Airway Management Planned: Natural Airway  Additional Equipment: None  Intra-op Plan:   Post-operative Plan:   Informed Consent: I have reviewed the patients History and Physical, chart, labs and discussed the procedure including the risks, benefits and alternatives for the proposed anesthesia with the patient or authorized representative who has indicated his/her understanding and acceptance.       Plan Discussed with: Anesthesiologist  Anesthesia Plan Comments: (Epidural functioned well for labor yesterday, plan to utilize for PPTL today with backup plan of GETA)          Anesthesia Quick Evaluation

## 2024-04-06 ENCOUNTER — Other Ambulatory Visit (HOSPITAL_COMMUNITY): Payer: Self-pay

## 2024-04-06 ENCOUNTER — Encounter (HOSPITAL_COMMUNITY): Payer: Self-pay | Admitting: Family Medicine

## 2024-04-06 MED ORDER — FUROSEMIDE 20 MG PO TABS
20.0000 mg | ORAL_TABLET | Freq: Every day | ORAL | 0 refills | Status: AC
  Filled 2024-04-06: qty 4, 4d supply, fill #0

## 2024-04-06 MED ORDER — ACETAMINOPHEN 500 MG PO TABS
1000.0000 mg | ORAL_TABLET | Freq: Four times a day (QID) | ORAL | 0 refills | Status: AC | PRN
  Filled 2024-04-06: qty 30, 4d supply, fill #0

## 2024-04-06 MED ORDER — NIFEDIPINE ER OSMOTIC RELEASE 30 MG PO TB24
30.0000 mg | ORAL_TABLET | Freq: Every day | ORAL | Status: DC
  Administered 2024-04-06: 30 mg via ORAL
  Filled 2024-04-06: qty 1

## 2024-04-06 MED ORDER — IBUPROFEN 600 MG PO TABS
600.0000 mg | ORAL_TABLET | Freq: Four times a day (QID) | ORAL | 0 refills | Status: AC | PRN
  Filled 2024-04-06: qty 30, 8d supply, fill #0

## 2024-04-06 MED ORDER — SENNOSIDES-DOCUSATE SODIUM 8.6-50 MG PO TABS
2.0000 | ORAL_TABLET | Freq: Every day | ORAL | 0 refills | Status: AC
  Filled 2024-04-06: qty 30, 15d supply, fill #0

## 2024-04-06 MED ORDER — NIFEDIPINE ER 30 MG PO TB24
30.0000 mg | ORAL_TABLET | Freq: Every day | ORAL | 0 refills | Status: AC
  Filled 2024-04-06: qty 60, 60d supply, fill #0

## 2024-04-06 MED ORDER — OXYCODONE HCL 5 MG PO TABS: 5.0000 mg | ORAL_TABLET | Freq: Four times a day (QID) | ORAL | Status: DC | PRN

## 2024-04-06 MED ORDER — POTASSIUM CHLORIDE CRYS ER 20 MEQ PO TBCR
20.0000 meq | EXTENDED_RELEASE_TABLET | Freq: Every day | ORAL | 0 refills | Status: AC
  Filled 2024-04-06: qty 4, 4d supply, fill #0

## 2024-04-06 NOTE — Lactation Note (Signed)
 This note was copied from a baby's chart. Lactation Consultation Note  Patient Name: Michele Phillips Unijb'd Date: 04/06/2024 Age:34 hours  MOB and infant asleep at this time. LC attempted to see family.    Maternal Data    Feeding    LATCH Score                    Lactation Tools Discussed/Used    Interventions    Discharge    Consult Status      [name] 04/06/2024, 1:34 AM

## 2024-04-06 NOTE — Lactation Note (Signed)
 This note was copied from a baby's chart. Lactation Consultation Note  Patient Name: Michele Phillips Date: 04/06/2024 Age:34 hours Reason for consult: Follow-up assessment Per mom has recently fed the baby and started supplementing due to not thinking the baby was getting enough milk at the breast.  LC discussed increasing the flow from the breast by hand expressing, pre- pumping with the hand pump and post pumping after the baby feeds until the milk comes in.  Since the baby has supplemented, if not satisfied after the feeding of the breast x 2 , supplement 25 - 30 ml.  LC reviewed breast feeding D/C teaching and the [facility] resources.  See below for details  Maternal Data Has patient been taught Hand Expression?: Yes Does the patient have breastfeeding experience prior to this delivery?: No  Feeding Mother's Current Feeding Choice: Breast Milk and Formula Nipple Type: Slow - flow  LATCH Score - 9     Lactation Tools Discussed/Used Tools: Pump;Flanges Flange Size: 18 (mom aware to order the flange inserts for smaller flanges #16 and is aware when her milk comes to pump up) Breast pump type: Manual Reason for Pumping: LC recommended mom  could pre -pump to prime the  milk ducts and add post pumping until the millk comes in due to mom saying she had no breast  changes  Interventions Interventions: Breast feeding basics reviewed;Education;LC Services brochure;CDC milk storage guidelines;CDC Guidelines for Breast Pump Cleaning;Hand pump  Discharge Discharge Education: Engorgement and breast care;Warning signs for feeding baby;Outpatient recommendation (per mom has a LC O/P with [place]) Pump: DEBP;Personal;Manual  Consult Status Consult Status: Complete Date: 04/06/24    Rollene Jenkins Fiedler 04/06/2024, 11:34 AM

## 2024-04-06 NOTE — Plan of Care (Signed)
  Problem: Health Behavior/Discharge Planning: Goal: Ability to manage health-related needs will improve Outcome: Progressing   Problem: Education: Goal: Knowledge of condition will improve Outcome: Progressing Goal: Individualized Educational Video(s) Outcome: Progressing Goal: Individualized Newborn Educational Video(s) Outcome: Progressing   Problem: Activity: Goal: Will verbalize the importance of balancing activity with adequate rest periods Outcome: Progressing Goal: Ability to tolerate increased activity will improve Outcome: Progressing   Problem: Coping: Goal: Ability to identify and utilize available resources and services will improve Outcome: Progressing   Problem: Life Cycle: Goal: Chance of risk for complications during the postpartum period will decrease Outcome: Progressing   Problem: Role Relationship: Goal: Ability to demonstrate positive interaction with newborn will improve Outcome: Progressing   Problem: Skin Integrity: Goal: Demonstration of wound healing without infection will improve Outcome: Progressing

## 2024-04-06 NOTE — Discharge Instructions (Signed)

## 2024-04-06 NOTE — Plan of Care (Signed)
  Problem: Health Behavior/Discharge Planning: Goal: Ability to manage health-related needs will improve 04/06/2024 1121 by Madison Rosina LABOR, LPN Outcome: Adequate for Discharge 04/06/2024 1016 by Madison Rosina LABOR, LPN Outcome: Progressing   Problem: Education: Goal: Knowledge of condition will improve 04/06/2024 1121 by Madison Rosina LABOR, LPN Outcome: Adequate for Discharge 04/06/2024 1016 by Madison Rosina LABOR, LPN Outcome: Progressing Goal: Individualized Educational Video(s) 04/06/2024 1121 by Madison Rosina LABOR, LPN Outcome: Adequate for Discharge 04/06/2024 1016 by Madison Rosina LABOR, LPN Outcome: Progressing Goal: Individualized Newborn Educational Video(s) 04/06/2024 1121 by Madison Rosina LABOR, LPN Outcome: Adequate for Discharge 04/06/2024 1016 by Madison Rosina LABOR, LPN Outcome: Progressing   Problem: Activity: Goal: Will verbalize the importance of balancing activity with adequate rest periods 04/06/2024 1121 by Madison Rosina LABOR, LPN Outcome: Adequate for Discharge 04/06/2024 1016 by Madison Rosina A, LPN Outcome: Progressing Goal: Ability to tolerate increased activity will improve 04/06/2024 1121 by Madison Rosina LABOR, LPN Outcome: Adequate for Discharge 04/06/2024 1016 by Madison Rosina LABOR, LPN Outcome: Progressing   Problem: Coping: Goal: Ability to identify and utilize available resources and services will improve 04/06/2024 1121 by Madison Rosina LABOR, LPN Outcome: Adequate for Discharge 04/06/2024 1016 by Madison Rosina LABOR, LPN Outcome: Progressing   Problem: Life Cycle: Goal: Chance of risk for complications during the postpartum period will decrease 04/06/2024 1121 by Madison Rosina LABOR, LPN Outcome: Adequate for Discharge 04/06/2024 1016 by Madison Rosina LABOR, LPN Outcome: Progressing   Problem: Role Relationship: Goal: Ability to demonstrate positive interaction with newborn will improve 04/06/2024 1121 by Madison Rosina LABOR, LPN Outcome: Adequate for Discharge 04/06/2024 1016 by Madison Rosina LABOR,  LPN Outcome: Progressing   Problem: Skin Integrity: Goal: Demonstration of wound healing without infection will improve 04/06/2024 1121 by Madison Rosina LABOR, LPN Outcome: Adequate for Discharge 04/06/2024 1016 by Madison Rosina LABOR, LPN Outcome: Progressing

## 2024-04-06 NOTE — Patient Instructions (Signed)
 Your appointment with Outpatient Lactation is: Date: 04/22/2024 Time: 10:00 AM [facility] (First Floor) [address] [place]  Check in under baby's name.  Please bring your baby hungry along with your pump and a bottle of either formula or expressed breast milk. Please also bring your pump flanges and we welcome support people! If you need lactation assistance before your appointment, please call [phone] for lactation voice mail.   If interested in an outpatient lactation consult in office or virtually please reach out to us  at [facility] (First Floor) [address] [place] Please feel free to out with any lactation related questions or concerns [phone]  to leave a message for our lactation voicemail box.  Lactation support groups:  [facility], Tuesdays 10:00 am -12:00 pm at [address] on the second floor in the conference room, lactating parents and lap babies welcome.  [email or web address]  [email or web address]      Michele Phillips, [facility] Center for [facility]

## 2024-04-07 LAB — SURGICAL PATHOLOGY

## 2024-04-08 ENCOUNTER — Ambulatory Visit

## 2024-04-12 ENCOUNTER — Telehealth (HOSPITAL_COMMUNITY): Payer: Self-pay | Admitting: *Deleted

## 2024-04-12 NOTE — Telephone Encounter (Signed)
 04/12/2024  Name: Michele Phillips MRN: [id number] DOB: 1990/08/14  Reason for Call:  Transition of Care Hospital Discharge Call  Contact Status: Patient Contact Status: Complete Return call received from patient. Language assistant needed:          Follow-Up Questions: Do You Have Any Concerns About Your Health As You Heal From Delivery?: Yes What Concerns Do You Have About Your Health?: Patient asked when she could remove the dressing covering her BTL incision. Patient is 8 days postpartum. RN instructed patient how to remove dressing. Patient voiced no other questions or concerns at this time. Do You Have Any Concerns About Your Infants Health?: No  Edinburgh Postnatal Depression Scale:  In the Past 7 Days: I have been able to laugh and see the funny side of things.: As much as I always could I have looked forward with enjoyment to things.: As much as I ever did I have blamed myself unnecessarily when things went wrong.: Yes, some of the time I have been anxious or worried for no good reason.: No, not at all I have felt scared or panicky for no good reason.: No, not at all Things have been getting on top of me.: No, I have been coping as well as ever I have been so unhappy that I have had difficulty sleeping.: Not at all I have felt sad or miserable.: No, not at all I have been so unhappy that I have been crying.: No, never The thought of harming myself has occurred to me.: Never Edinburgh Postnatal Depression Scale Total: 2  PHQ2-9 Depression Scale:     Discharge Follow-up: Edinburgh score requires follow up?: No Patient was advised of the following resources:: Breastfeeding Support Group, Support Group  Post-discharge interventions: Reviewed Newborn Safe Sleep Practices  Signature Allean IVAR Carton, RN, 04/12/24, 2008

## 2024-04-12 NOTE — Telephone Encounter (Signed)
 Attempted hospital discharge follow-up call. Left message for patient to return RN call with any questions or concerns. Allean IVAR Carton, RN, 04/12/24, [phone]

## 2024-04-14 ENCOUNTER — Encounter: Admitting: Family Medicine

## 2024-04-14 ENCOUNTER — Ambulatory Visit

## 2024-04-21 ENCOUNTER — Encounter: Admitting: Obstetrics & Gynecology

## 2024-04-24 ENCOUNTER — Inpatient Hospital Stay (HOSPITAL_COMMUNITY): Admit: 2024-04-24

## 2024-04-28 ENCOUNTER — Encounter: Admitting: Advanced Practice Midwife

## 2024-05-02 ENCOUNTER — Ambulatory Visit: Admitting: Obstetrics and Gynecology

## 2024-05-02 ENCOUNTER — Other Ambulatory Visit

## 2024-05-04 ENCOUNTER — Other Ambulatory Visit (HOSPITAL_BASED_OUTPATIENT_CLINIC_OR_DEPARTMENT_OTHER): Payer: Self-pay

## 2024-05-05 ENCOUNTER — Telehealth: Payer: Self-pay | Admitting: Family Medicine

## 2024-05-05 NOTE — Telephone Encounter (Signed)
 Wrist pain off and on after having baby 04/04/24 more centered around the thumb, its the a pop and tingling feeling. Pain has been getting worst. Would like a nurse to call her and figure out everything.

## 2024-05-05 NOTE — Telephone Encounter (Signed)
 Patient is calling because she received a VM from a nurse who did not address any of her concerns. I asked the patient if she was able to send the nurses a message through [place] and she declined because she already called and explained everything and is just waiting to speak to a nurse.

## 2024-05-05 NOTE — Telephone Encounter (Signed)
 Left message for pt to return call if she continues to have questions or concerns or send a MyChart message.   Valynn Schamberger,RN  05/05/24

## 2024-05-05 NOTE — Telephone Encounter (Signed)
 Called pt and pt informed that she may have carpel tunnel and the use a wrist brace could be helpful along with the use Tylenol  and ibuprofen .  I explained that if her pain does not get better by her appt on 06/13/24 we do have a provider that could do an injection.  Pt verbalized understanding with no further questions.   Michele Oestreicher,RN  05/05/24

## 2024-05-16 ENCOUNTER — Encounter: Payer: Self-pay | Admitting: Medical

## 2024-05-25 ENCOUNTER — Other Ambulatory Visit: Payer: Self-pay

## 2024-05-25 DIAGNOSIS — Z8632 Personal history of gestational diabetes: Secondary | ICD-10-CM

## 2024-05-27 ENCOUNTER — Other Ambulatory Visit

## 2024-05-27 ENCOUNTER — Ambulatory Visit: Admitting: Certified Nurse Midwife

## 2024-05-27 ENCOUNTER — Encounter: Payer: Self-pay | Admitting: Certified Nurse Midwife

## 2024-05-27 ENCOUNTER — Other Ambulatory Visit: Payer: Self-pay

## 2024-05-27 DIAGNOSIS — Z8632 Personal history of gestational diabetes: Secondary | ICD-10-CM

## 2024-05-27 DIAGNOSIS — G5603 Carpal tunnel syndrome, bilateral upper limbs: Secondary | ICD-10-CM

## 2024-05-27 NOTE — Progress Notes (Unsigned)
 Post Partum Visit Note  Michele Phillips is a 33 y.o. G44P2002 female who presents for a postpartum visit. She is 7 weeks postpartum following a normal spontaneous vaginal delivery.  I have fully reviewed the prenatal and intrapartum course. The delivery was at 37/1 gestational weeks.  Anesthesia: epidural. Postpartum course has been good. Baby is doing well. Baby is feeding by bottle - Similac total comfort. Bleeding thick, heavy lochia. Bowel function is normal. Bladder function is normal. Patient is sexually active. Contraception method is tubal ligation. Postpartum depression screening: negative.   The pregnancy intention screening data noted above was reviewed. Potential methods of contraception were discussed. The patient elected to proceed with No data recorded.   Edinburgh Postnatal Depression Scale - 05/27/24 0845       Edinburgh Postnatal Depression Scale:  In the Past 7 Days   I have been able to laugh and see the funny side of things. 0    I have looked forward with enjoyment to things. 0    I have blamed myself unnecessarily when things went wrong. 0    I have been anxious or worried for no good reason. 0    I have felt scared or panicky for no good reason. 0    Things have been getting on top of me. 0    I have been so unhappy that I have had difficulty sleeping. 0    I have felt sad or miserable. 0    I have been so unhappy that I have been crying. 0    The thought of harming myself has occurred to me. 0    Edinburgh Postnatal Depression Scale Total 0          Health Maintenance Due  Topic Date Due   Pneumococcal Vaccine (1 of 2 - PCV) Never done   Hepatitis B Vaccines 19-59 Average Risk (1 of 3 - 19+ 3-dose series) Never done   HPV VACCINES (1 - Risk 3-dose SCDM series) Never done   Cervical Cancer Screening (HPV/Pap Cotest)  Never done   COVID-19 Vaccine (1 - 2025-26 season) Never done    The following portions of the patient's history were reviewed and  updated as appropriate: allergies, current medications, past family history, past medical history, past social history, past surgical history, and problem list.  Review of Systems Pertinent items are noted in HPI.  Objective:  BP (!) 139/96   Pulse 85   Ht 5' 2 (1.575 m)   Wt 171 lb 3.2 oz (77.7 kg)   LMP 07/10/2023   Breastfeeding No   BMI 31.31 kg/m    General:  alert, cooperative, appears stated age, and no distress   Breasts:  not indicated  Lungs: clear to auscultation bilaterally  Heart:  regular rate and rhythm, S1, S2 normal, no murmur, click, rub or gallop  Abdomen: soft, non-tender; bowel sounds normal; no masses,  no organomegaly   Wound well approximated incision  GU exam:  not indicated       Assessment:   1. Postpartum care and examination (Primary) - Benign  2. Bilateral carpal tunnel syndrome - Wrist brace, ergonomics, PT referral.     Benign postpartum exam.   Plan:   Essential components of care per ACOG recommendations:  1.  Mood and well being: Patient with negative depression screening today. Reviewed local resources for support.  - Patient tobacco use? No.   - hx of drug use? No.    2. Infant care and  feeding:  -Patient currently breastmilk feeding? No.  -Social determinants of health (SDOH) reviewed in EPIC. No concerns  3. Sexuality, contraception and birth spacing - Patient does not want a pregnancy in the next year.  Desired family size is 2 children.  - Reviewed reproductive life planning. Reviewed contraceptive methods based on pt preferences and effectiveness.  Patient desired Female Sterilization today.   - Discussed birth spacing of 18 months  4. Sleep and fatigue -Encouraged family/partner/community support of 4 hrs of uninterrupted sleep to help with mood and fatigue  5. Physical Recovery  - Discussed patients delivery and complications. She describes her labor as good. - Patient had a Vaginal, no problems at delivery. Patient  had no laceration. Perineal healing reviewed. Patient expressed understanding - Patient has urinary incontinence? No. - Patient is safe to resume physical and sexual activity  6.  Health Maintenance - HM due items addressed Yes - Last pap smear No results found for: DIAGPAP Pap smear not done at today's visit. Patient to return in January per request -Breast Cancer screening indicated? No.   7. Chronic Disease/Pregnancy Condition follow up: Gestational Diabetes  - PCP follow up  [name], CNM Center for [employer], [facility]

## 2024-05-27 NOTE — Patient Instructions (Addendum)
 You may use topical magnesium as discussed. Dr. Ferdinand is a good brand that is widely distributed and reasonably priced. You will also be able to find wrist braces for support for carpal tunnel in the medical supply section of most box stores and pharmacies.  I am also sending a referral for PT for you to help with the carpal tunnel.   Please reach out with other concerns.  Your panorama was low risk for genetic abnormalities for the fetus (good news!). If you open this result, you will see the sex of the baby. If you do not wish to know the sex of the baby, do not open the result labelled Panorama.   Thank you for trusting us  to care for you, Michele Phillips, Midwife

## 2024-05-28 ENCOUNTER — Encounter: Payer: Self-pay | Admitting: Certified Nurse Midwife

## 2024-05-28 LAB — GLUCOSE TOLERANCE, 2 HOURS
Glucose, 2 hour: 91 mg/dL (ref 70–139)
Glucose, GTT - Fasting: 102 mg/dL — ABNORMAL HIGH (ref 70–99)

## 2024-06-08 ENCOUNTER — Ambulatory Visit: Payer: Self-pay | Admitting: Certified Nurse Midwife

## 2024-06-13 ENCOUNTER — Ambulatory Visit: Admitting: Obstetrics and Gynecology

## 2024-06-13 ENCOUNTER — Other Ambulatory Visit

## 2024-07-19 ENCOUNTER — Encounter: Payer: Self-pay | Admitting: Certified Nurse Midwife

## 2024-07-19 ENCOUNTER — Other Ambulatory Visit (HOSPITAL_COMMUNITY)
Admission: RE | Admit: 2024-07-19 | Discharge: 2024-07-19 | Disposition: A | Source: Ambulatory Visit | Attending: Certified Nurse Midwife | Admitting: Certified Nurse Midwife

## 2024-07-19 ENCOUNTER — Ambulatory Visit: Admitting: Certified Nurse Midwife

## 2024-07-19 ENCOUNTER — Other Ambulatory Visit: Payer: Self-pay

## 2024-07-19 VITALS — BP 145/82 | HR 79 | Ht 62.0 in | Wt 176.0 lb

## 2024-07-19 DIAGNOSIS — Z124 Encounter for screening for malignant neoplasm of cervix: Secondary | ICD-10-CM

## 2024-07-19 NOTE — Progress Notes (Signed)
" ° °  Subjective:     Michele Phillips is a 34 y.o. female here at [facility] for a routine exam.  Current complaints: none, here for pap.  Personal and family health history reviewed: yes.  Do you have a primary care provider? [facility] Do you feel safe at home? Yes  Flowsheet Row Office Visit from 07/19/2024 in [facility] [employer] [facility]  PHQ-2 Total Score 0    Health Maintenance Due  Topic Date Due   Pneumococcal Vaccine (1 of 2 - PCV) Never done   Hepatitis B Vaccines 19-59 Average Risk (1 of 3 - 19+ 3-dose series) Never done   Cervical Cancer Screening (HPV/Pap Cotest)  Never done   COVID-19 Vaccine (1 - 2025-26 season) Never done     Risk factors for chronic health problems: Smoking: Alchohol/how much: Pt BMI: Body mass index is 32.19 kg/m.   Gynecologic History Patient's last menstrual period was 07/04/2024 (approximate). Contraception: tubal ligation Pap today.  Last mammogram: never due to age  Obstetric History OB History  Gravida Para Term Preterm AB Living  2 2 2  0 0 2  SAB IAB Ectopic Multiple Live Births     0 2    # Outcome Date GA Lbr Len/2nd Weight Sex Type Anes PTL Lv  2 Term 04/04/24 [redacted]w[redacted]d / 00:19 6 lb 5.2 oz (2.87 kg) F Vag-Spont EPI  LIV  1 Term 01/15/11 [redacted]w[redacted]d 15:45 / 00:45 6 lb 3.4 oz (2.818 kg) M Vag-Spont EPI  LIV     The following portions of the patient's history were reviewed and updated as appropriate: allergies, current medications, past family history, past medical history, past social history, past surgical history, and problem list.  Review of Systems Pertinent items are noted in HPI.    Objective:   Today's Vitals   07/19/24 1546  BP: (!) 145/82  Pulse: 79  Weight: 176 lb (79.8 kg)  Height: 5' 2 (1.575 m)  PainSc: 0-No pain   Body mass index is 32.19 kg/m.  VS reviewed, nursing note reviewed,  Constitutional: well developed, well nourished, no distress HEENT: normocephalic, thyroid without  enlargement or mass HEART: RRR, no murmurs rubs/gallops RESP: clear and equal to auscultation bilaterally in all lobes  Breast Exam:  Deferred with low risks and shared decision making, discussed recommendation to start mammogram between 40-50 yo/ exam  Abdomen: soft Neuro: alert and oriented x 3 Skin: warm, dry Psych: affect normal Pelvic exam: / Performed: Cervix pink, visually closed, without lesion, scant white creamy discharge, vaginal walls and external genitalia normal. Pap collected.        Assessment/Plan:   1. Screening for cervical cancer (Primary) - Pap today.  - STI from Pap only.       Return in about 1 year (around 07/19/2025) for Annual.   Michele Phillips, CNM 4:21 PM   "

## 2024-07-21 LAB — CYTOLOGY - PAP
Chlamydia: NEGATIVE
Comment: NEGATIVE
Comment: NEGATIVE
Comment: NEGATIVE
Comment: NORMAL
Diagnosis: NEGATIVE
High risk HPV: NEGATIVE
Neisseria Gonorrhea: NEGATIVE
Trichomonas: NEGATIVE

## 2024-07-22 ENCOUNTER — Ambulatory Visit: Payer: Self-pay | Admitting: Certified Nurse Midwife

## 2024-07-22 NOTE — Progress Notes (Signed)
 Normal Pap. Repeat in 5 years.   Camie Rote, MSN, CNM, RNC-OB Certified Nurse Midwife, [facility] 07/22/2024 1:49 PM
